# Patient Record
Sex: Male | Born: 1980
Health system: Southern US, Community
[De-identification: ages and names within clinical notes are randomized; demographics above are authoritative.]

## PROBLEM LIST (undated history)

## (undated) DIAGNOSIS — T7840XA Allergy, unspecified, initial encounter: Secondary | ICD-10-CM

## (undated) DIAGNOSIS — J939 Pneumothorax, unspecified: Secondary | ICD-10-CM

## (undated) HISTORY — DX: Pneumothorax, unspecified: J93.9

## (undated) HISTORY — PX: WISDOM TOOTH EXTRACTION: SHX21

## (undated) HISTORY — DX: Allergy, unspecified, initial encounter: T78.40XA

---

## 1999-05-19 ENCOUNTER — Emergency Department (HOSPITAL_COMMUNITY): Admission: EM | Admit: 1999-05-19 | Discharge: 1999-05-19 | Payer: Self-pay | Admitting: Emergency Medicine

## 1999-05-27 ENCOUNTER — Emergency Department (HOSPITAL_COMMUNITY): Admission: EM | Admit: 1999-05-27 | Discharge: 1999-05-27 | Payer: Self-pay | Admitting: Internal Medicine

## 2001-03-10 ENCOUNTER — Encounter: Payer: Self-pay | Admitting: Emergency Medicine

## 2001-03-10 ENCOUNTER — Observation Stay (HOSPITAL_COMMUNITY): Admission: EM | Admit: 2001-03-10 | Discharge: 2001-03-11 | Payer: Self-pay | Admitting: *Deleted

## 2001-03-11 ENCOUNTER — Encounter: Payer: Self-pay | Admitting: Surgery

## 2001-03-13 ENCOUNTER — Encounter: Admission: RE | Admit: 2001-03-13 | Discharge: 2001-03-13 | Payer: Self-pay | Admitting: Surgery

## 2001-03-13 ENCOUNTER — Encounter: Payer: Self-pay | Admitting: Surgery

## 2004-12-21 ENCOUNTER — Ambulatory Visit (HOSPITAL_COMMUNITY): Admission: RE | Admit: 2004-12-21 | Discharge: 2004-12-21 | Payer: Self-pay | Admitting: *Deleted

## 2004-12-21 ENCOUNTER — Emergency Department (HOSPITAL_COMMUNITY): Admission: EM | Admit: 2004-12-21 | Discharge: 2004-12-21 | Payer: Self-pay | Admitting: Emergency Medicine

## 2009-09-18 ENCOUNTER — Emergency Department (HOSPITAL_COMMUNITY): Admission: EM | Admit: 2009-09-18 | Discharge: 2009-09-19 | Payer: Self-pay | Admitting: Emergency Medicine

## 2011-05-06 NOTE — Discharge Summary (Signed)
[facility]  Patient:    Cory Washington, Cory Washington                     MRN: [id number] Adm. Date:  [id number] Disc. Date: [id number] Attending:  Cleatrice Burke Dictator:   Loura Pardon, P.A.                           Discharge Summary  DATE OF BIRTH:  September 06, 1981  TIME OF ADMISSION:  7 a.m.  TIME OF DISCHARGE:  7 p.m.  DISCHARGE DIAGNOSES:  A 15% spontaneous left pneumothorax.  SECONDARY DIAGNOSES:  Noncontributory.  PROCEDURES:  Chest x-ray x 2 to confirm stability of spontaneous left pneumothorax.  SURGEON:  Dr. Evelene Croon.  DISCHARGE DIET:  Cory Washington was discharged the same day as he was admitted with stable spontaneous left pneumothorax.  He is achieving excellent oxygen saturations on room air alone.  His pain which he has been feeling in the left anterior chest has been helped with Percocet, although it is still present.  DISCHARGE MEDICATIONS:  He goes home on the following medication:  Percocet 5/325 one to two tabs p.o. q.4-6h. p.r.n.  DISCHARGE FOLLOWUP:  He has follow-up with Dr. Laneta Simmers Tuesday, March 26; the office will call with the time.  He will get a chest x-ray one hour before the visit at [facility].  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 Laneta Simmers Tuesday, March 26.  BRIEF ADMISSION HISTORY:  Cory Washington is a 30 year old nonsmoker previously in good health who presented to the [facility] Emergency Room last evening with sudden onset of left anterior chest pain while washing a car.  Chest x-ray showed a 25% spontaneous left pneumothorax.  Oxygen saturations were normal on room air.  He has not had any prior history of pneumothorax.  HOSPITAL COURSE:  After admission to [facility] by Dr. Laneta Simmers, he had a follow-up chest x-ray which showed  no progression in the left spontaneous pneumothorax.  It was judged that he could go home with follow-up in two days with Dr. Laneta Simmers with a repeat chest x-ray.  He goes home on the medications and in stable condition with good oxygen saturations. DD:  03/11/01 TD:  03/12/01 Job: [id number]

## 2013-09-30 ENCOUNTER — Ambulatory Visit (INDEPENDENT_AMBULATORY_CARE_PROVIDER_SITE_OTHER): Payer: BC Managed Care – PPO | Admitting: Physician Assistant

## 2013-09-30 DIAGNOSIS — Z111 Encounter for screening for respiratory tuberculosis: Secondary | ICD-10-CM

## 2013-09-30 DIAGNOSIS — L603 Nail dystrophy: Secondary | ICD-10-CM

## 2013-09-30 DIAGNOSIS — Z0000 Encounter for general adult medical examination without abnormal findings: Secondary | ICD-10-CM

## 2013-09-30 DIAGNOSIS — Z23 Encounter for immunization: Secondary | ICD-10-CM

## 2013-09-30 NOTE — Progress Notes (Signed)
I have examined this patient along with the student and agree.  

## 2013-09-30 NOTE — Progress Notes (Signed)

## 2013-09-30 NOTE — Progress Notes (Signed)
Subjective:    Patient ID: Cory Washington, male    DOB: 12-14-1981, 32 y.o.   MRN: [id number]  HPI  Cory Washington is a 32 year old male who is here for a CPE and health screen for the [facility]. He complains of dry hands and feet along with nail abnormalities on his right second digit on his hand and his left 5th digit on his foot. His right third digit also had the abnormality but eventually it grew out. There is no itch associated with it. NO numbness, tingling, or burning in his hands or feet. He has no other complaints.   He would like the minimum done for [place] to complete his form. He does not want to have labs and declines the flu vaccine.   Review of Systems  Constitutional: Negative for fever, chills, appetite change, fatigue and unexpected weight change.  HENT: Negative for congestion, dental problem, ear discharge, ear pain, postnasal drip, rhinorrhea, sinus pressure, sore throat and trouble swallowing.   Eyes: Negative for pain, discharge, itching and visual disturbance.  Respiratory: Negative for cough, chest tightness and shortness of breath.   Cardiovascular: Negative for chest pain, palpitations and leg swelling.  Gastrointestinal: Negative for nausea, vomiting, abdominal pain, diarrhea and constipation.  Endocrine: Negative for cold intolerance, heat intolerance, polydipsia and polyuria.  Genitourinary: Negative for urgency, frequency, discharge, difficulty urinating, penile pain and testicular pain.  Musculoskeletal: Negative for arthralgias, back pain and joint swelling.  Skin: Negative for color change and rash.  Allergic/Immunologic: Positive for food allergies (suspects wheat). Negative for environmental allergies.  Neurological: Negative for dizziness, weakness, numbness and headaches.  Hematological: Negative for adenopathy. Does not bruise/bleed easily.  Psychiatric/Behavioral: Negative for dysphoric mood. The patient is not nervous/anxious.         Objective:   Physical Exam  Constitutional: Vital signs are normal. He appears well-developed and well-nourished.  HENT:  Head: Normocephalic and atraumatic.  Right Ear: Hearing, tympanic membrane, external ear and ear canal normal.  Left Ear: Hearing, tympanic membrane, external ear and ear canal normal.  Nose: Nose normal.  Mouth/Throat: Uvula is midline, oropharynx is clear and moist and mucous membranes are normal. Normal dentition. No dental caries.  Eyes: Conjunctivae and EOM are normal. Pupils are equal, round, and reactive to light. Right eye exhibits no discharge. Left eye exhibits no discharge. No scleral icterus.  Fundoscopic exam:      The right eye shows no arteriolar narrowing, no AV nicking, no exudate, no hemorrhage and no papilledema. The right eye shows red reflex.       The left eye shows no arteriolar narrowing, no AV nicking, no exudate, no hemorrhage and no papilledema. The left eye shows red reflex.  Neck: Trachea normal, normal range of motion and full passive range of motion without pain. No mass present.  Cardiovascular: Normal rate, regular rhythm, normal heart sounds and normal pulses.   Pulmonary/Chest: Effort normal and breath sounds normal.  Abdominal: Normal appearance and bowel sounds are normal. There is no tenderness.  Genitourinary:  Patient deferred exam  Musculoskeletal: Normal range of motion.  Lymphadenopathy:    He has no cervical adenopathy.  Neurological: He is alert. He has normal strength and normal reflexes. No cranial nerve deficit or sensory deficit. Coordination and gait normal.  Skin: Skin is warm and dry.     Psychiatric: He has a normal mood and affect.   Vital signs obtained and entered were normal, but recordings have disappeared  from the electronic record.     Assessment & Plan:  Routine general medical examination at a health care facility- Plan: patient declined GU exam and screening labs today. Counseled patient on  importance on monthly self-testicular exams; Age appropriate anticipatory guidance provided.  Screening-pulmonary TB - screened per CDC guidelines.  Not infectious.  Need for influenza vaccination - declined, despite urging.  Nail dystrophy - as one nail with similar symptoms has resolved spontaneously, he elects to monitor for now.

## 2013-09-30 NOTE — Patient Instructions (Signed)

## 2015-04-30 ENCOUNTER — Ambulatory Visit (INDEPENDENT_AMBULATORY_CARE_PROVIDER_SITE_OTHER): Payer: BLUE CROSS/BLUE SHIELD | Admitting: Sports Medicine

## 2015-04-30 VITALS — BP 100/62 | HR 84 | Temp 97.9°F | Resp 16 | Ht 70.0 in | Wt 135.0 lb

## 2015-04-30 DIAGNOSIS — Z0000 Encounter for general adult medical examination without abnormal findings: Secondary | ICD-10-CM | POA: Diagnosis not present

## 2015-04-30 NOTE — Progress Notes (Signed)
  Cory Washington - 34 y.o. male MRN [id number]  Date of birth: 05/31/1981  SUBJECTIVE: CC: 1. Well Check, foster parent     HPI:   no acute medical conditions  TB risk questionnaire in symptom questionnaire screened normal  Health maintenance up-to-date including pertussis vaccine for newborn expected later this month  Discussed routine lab testing patient declined time.      ROS:   her history of present illness and as above per screening.    HISTORY:  Past Medical, Surgical, Social, and Family History reviewed & updated per EMR.  Pertinent Historical Findings include: Social History   Occupational History  . plumber    Social History Main Topics  . Smoking status: Never Smoker   . Smokeless tobacco: Not on file  . Alcohol Use: No  . Drug Use: No  . Sexual Activity:    Partners: Female    CopyBirth Control/ Protection: None    History   Social History Narrative   Lives at home with his wife.    Short term foster parent   Son expected May 2016 - son - Lliam      OBJECTIVE:  VS:   HT:5\' 10"  (177.8 cm)   WT:135 lb (61.236 kg)  BMI:19.4          BP:100/62 mmHg  HR:84bpm  TEMP:97.9 F (36.6 C)(Oral)  RESP:98 %  Physical Exam  Constitutional: He is well-developed, well-nourished, and in no distress.  HENT:  Head: Normocephalic and atraumatic.  Right Ear: External ear normal.  Left Ear: External ear normal.  Neck: No JVD present. No tracheal deviation present.  Cardiovascular: Normal rate, regular rhythm and normal heart sounds.  Exam reveals no gallop and no friction rub.   No murmur heard. Pulmonary/Chest: Effort normal and breath sounds normal. No respiratory distress. He has no wheezes. He exhibits no tenderness.  Abdominal: Soft.  Musculoskeletal: He exhibits no edema or tenderness.  Neurological: He is alert.  Moves all 4 extremities spontaneously; no lateralization.  Skin: Skin is warm and dry. He is not diaphoretic.  Psychiatric: Mood, memory,  affect and judgment normal.  Vitals reviewed.   ASSESSMENT & PLAN: See problem based charting & AVS for additional documentation Problem List Items Addressed This Visit    Health care maintenance - Primary    Otherwise healthy. Foster parent form filled out and completed today. Discussed health maintenance as well as risk reduction Follow up when necessary         FOLLOW UP:  Return if symptoms worsen or fail to improve.

## 2015-04-30 NOTE — Progress Notes (Deleted)
Physical Exam

## 2015-04-30 NOTE — Progress Notes (Signed)

## 2015-04-30 NOTE — Assessment & Plan Note (Signed)
Otherwise healthy. Foster parent form filled out and completed today. Discussed health maintenance as well as risk reduction Follow up when necessary

## 2016-07-05 ENCOUNTER — Ambulatory Visit (HOSPITAL_COMMUNITY)
Admission: EM | Admit: 2016-07-05 | Discharge: 2016-07-05 | Disposition: A | Discharge status: Home / Self Care | Payer: 59 | Attending: Family Medicine | Admitting: Family Medicine

## 2016-07-05 ENCOUNTER — Encounter (HOSPITAL_COMMUNITY): Payer: Self-pay | Admitting: Emergency Medicine

## 2016-07-05 DIAGNOSIS — M7052 Other bursitis of knee, left knee: Secondary | ICD-10-CM

## 2016-07-05 MED ORDER — INDOMETHACIN 50 MG PO CAPS: 50.0000 mg | ORAL_CAPSULE | Freq: Two times a day (BID) | ORAL | Status: DC

## 2016-07-05 MED ORDER — HYDROCODONE-ACETAMINOPHEN 7.5-325 MG PO TABS: 1.0000 | ORAL_TABLET | Freq: Four times a day (QID) | ORAL | Status: DC | PRN

## 2016-07-05 MED ORDER — TRIAMCINOLONE ACETONIDE 40 MG/ML IJ SUSP (RADIOLOGY): 40.0000 mg | Freq: Once | INTRAMUSCULAR | Status: DC

## 2016-07-05 MED ORDER — TRIAMCINOLONE ACETONIDE 40 MG/ML IJ SUSP
INTRAMUSCULAR | Status: AC
  Filled 2016-07-05: qty 1

## 2016-07-05 NOTE — Discharge Instructions (Signed)
Bursitis Bursitis is when the fluid-filled sac (bursa) that covers and protects a joint is swollen (inflamed). Bursitis is most common near joints, especially the knees, elbows, hips, and shoulders.  HOME CARE  Take medicines only as told by your doctor.  If you were prescribed an antibiotic medicine, finish it all even if you start to feel better.  Rest the affected area as told by your doctor.  Keep the area raised up.  Avoid doing things that make the pain worse.  Apply ice to the injured area:  Place ice in a plastic bag.  Place a towel between your skin and the bag.  Leave the ice on for 20 minutes, 2-3 times a day.  Use splints, braces, pads, or walking aids as told by your doctor.  Keep all follow-up visits as told by your doctor. This is important. GET HELP IF:   You have more pain with home care.  You have a fever.  You have chills.   This information is not intended to replace advice given to you by your health care provider. Make sure you discuss any questions you have with your health care provider.   Document Released: 05/25/2010 Document Revised: 12/26/2014 Document Reviewed: 02/24/2014 Elsevier Interactive Patient Education 2016 Elsevier Inc.  [employer] LockerHeat Therapy Heat therapy can help ease sore, stiff, injured, and tight muscles and joints. Heat relaxes your muscles, which may help ease your pain.  RISKS AND COMPLICATIONS If you have any of the following conditions, do not use heat therapy unless your health care provider has approved:  Poor circulation.  Healing wounds or scarred skin in the area being treated.  Diabetes, heart disease, or high blood pressure.  Not being able to feel (numbness) the area being treated.  Unusual swelling of the area being treated.  Active infections.  Blood clots.  Cancer.  Inability to communicate pain. This may include young children and people who have problems with their brain function  (dementia).  Pregnancy. Heat therapy should only be used on old, pre-existing, or long-lasting (chronic) injuries. Do not use heat therapy on new injuries unless directed by your health care provider. HOW TO USE HEAT THERAPY There are several different kinds of heat therapy, including:  Moist heat pack.  Warm water bath.  Hot water bottle.  Electric heating pad.  Heated gel pack.  Heated wrap.  Electric heating pad. Use the heat therapy method suggested by your health care provider. Follow your health care provider's instructions on when and how to use heat therapy. GENERAL HEAT THERAPY RECOMMENDATIONS  Do not sleep while using heat therapy. Only use heat therapy while you are awake.  Your skin may turn pink while using heat therapy. Do not use heat therapy if your skin turns red.  Do not use heat therapy if you have new pain.  High heat or long exposure to heat can cause burns. Be careful when using heat therapy to avoid burning your skin.  Do not use heat therapy on areas of your skin that are already irritated, such as with a rash or sunburn. SEEK MEDICAL CARE IF:  You have blisters, redness, swelling, or numbness.  You have new pain.  Your pain is worse. MAKE SURE YOU:  Understand these instructions.  Will watch your condition.  Will get help right away if you are not doing well or get worse.   This information is not intended to replace advice given to you by your health care provider. Make sure you discuss any  questions you have with your health care provider.   Document Released: 02/27/2012 Document Revised: 12/26/2014 Document Reviewed: 01/28/2014 Elsevier Interactive Patient Education [employer]

## 2016-07-05 NOTE — ED Notes (Signed)
PT reports left knee pain that started yesterday around lunch time. PT reports it became more painful and it started to swell last night when PT took weight off it it. PT also reports nausea and vomiting last night, but is feeling better today. PT is not aware of any injury.

## 2016-07-05 NOTE — ED Provider Notes (Signed)
CSN: [id number]     Arrival date & time 07/05/16  1101 History   First MD Initiated Contact with Patient 07/05/16 1321     Chief Complaint  Patient presents with  . Knee Pain   (Consider location/radiation/quality/duration/timing/severity/associated sxs/prior Treatment) HPI History obtained from patient:  Pt presents with the cc of:  Left knee pain Duration of symptoms: 2 days Treatment prior to arrival: Ibuprofen Context: Patient works as a Barrister's clerkplumber's apprentice and has been doing quite a bit of kneeling over the last couple of days. He complains of pain in the left knee this morning he states that it became swollen. Ibuprofen is not helping this discomfort. Other symptoms include: Painful to walk Pain score: 4 FAMILY HISTORY: None    Past Medical History  Diagnosis Date  . Pneumothorax    History reviewed. No pertinent past surgical history. No family history on file. Social History  Substance Use Topics  . Smoking status: Never Smoker   . Smokeless tobacco: None  . Alcohol Use: No    Review of Systems  Denies: HEADACHE, NAUSEA, ABDOMINAL PAIN, CHEST PAIN, CONGESTION, DYSURIA, SHORTNESS OF BREATH  Allergies  Review of patient's allergies indicates no known allergies.  Home Medications   Prior to Admission medications   Medication Sig Start Date End Date Taking? Authorizing Provider  HYDROcodone-acetaminophen (NORCO) 7.5-325 MG tablet Take 1 tablet by mouth every 6 (six) hours as needed for moderate pain. 07/05/16   Tharon AquasFrank C Shoshannah Faubert, PA  indomethacin (INDOCIN) 50 MG capsule Take 1 capsule (50 mg total) by mouth 2 (two) times daily with a meal. 07/05/16   Tharon AquasFrank C Kaelan Emami, PA   Meds Ordered and Administered this Visit   Medications  triamcinolone acetonide (KENALOG-40) injection (RADIOLOGY ONLY) 40 mg (not administered)    BP 98/62 mmHg  Pulse 68  Temp(Src) 97.9 F (36.6 C) (Oral)  Resp 16  SpO2 100% No data found.   Physical Exam NURSES NOTES AND VITAL SIGNS  REVIEWED. CONSTITUTIONAL: Well developed, well nourished, no acute distress HEENT: normocephalic, atraumatic EYES: Conjunctiva normal NECK:normal ROM, supple, no adenopathy PULMONARY:No respiratory distress, normal effort ABDOMINAL: Soft, ND, NT BS+, No CVAT MUSCULOSKELETAL: Normal ROM of all extremities, left knee subpatellar bursa is swollen and tender to palpation warm to touch. There is no crepitus no signs of cellulitis no signs of infection. SKIN: warm and dry without rash PSYCHIATRIC: Mood and affect, behavior are normal  ED Course  Injection of joint Date/Time: 07/05/2016 3:35 PM Performed by: Tharon AquasPATRICK, Dina Warbington C Authorized by: Bradd CanaryKINDL, JAMES D Consent: Verbal consent obtained. Risks and benefits: risks, benefits and alternatives were discussed Consent given by: patient Patient identity confirmed: verbally with patient Time out: Immediately prior to procedure a "time out" was called to verify the correct patient, procedure, equipment, support staff and site/side marked as required. Preparation: Patient was prepped and draped in the usual sterile fashion. Local anesthesia used: no Patient tolerance: Patient tolerated the procedure well with no immediate complications   (including critical care time)  Labs Review Labs Reviewed - No data to display  Imaging Review No results found.   Visual Acuity Review  Right Eye Distance:   Left Eye Distance:   Bilateral Distance:    Right Eye Near:   Left Eye Near:    Bilateral Near:     Prescriptions for prednisone and hydrocodone are provided. Return to work note is given also.  MDM   1. Infrapatellar bursitis of left knee     Patient is reassured  that there are no issues that require transfer to higher level of care at this time or additional tests. Patient is advised to continue home symptomatic treatment. Patient is advised that if there are new or worsening symptoms to attend the emergency department, contact primary care  provider, or return to UC. Instructions of care provided discharged home in stable condition.    THIS NOTE WAS GENERATED USING A VOICE RECOGNITION SOFTWARE PROGRAM. ALL REASONABLE EFFORTS  WERE MADE TO PROOFREAD THIS DOCUMENT FOR ACCURACY.  I have verbally reviewed the discharge instructions with the patient. A printed AVS was given to the patient.  All questions were answered prior to discharge.      Tharon Aquas, PA 07/05/16 1535

## 2017-02-24 ENCOUNTER — Ambulatory Visit (HOSPITAL_COMMUNITY)
Admission: EM | Admit: 2017-02-24 | Discharge: 2017-02-24 | Disposition: A | Discharge status: Home / Self Care | Payer: 59 | Attending: Emergency Medicine | Admitting: Emergency Medicine

## 2017-02-24 ENCOUNTER — Encounter (HOSPITAL_COMMUNITY): Payer: Self-pay | Admitting: Emergency Medicine

## 2017-02-24 DIAGNOSIS — M7051 Other bursitis of knee, right knee: Secondary | ICD-10-CM

## 2017-02-24 MED ORDER — DICLOFENAC POTASSIUM 50 MG PO TABS: 50.0000 mg | ORAL_TABLET | Freq: Three times a day (TID) | ORAL | 0 refills | Status: DC

## 2017-02-24 NOTE — Discharge Instructions (Signed)
Apply ice over the inflamed bursa. Take the diclofenac as directed with food to help with inflammation. Wear the wrap around the swollen bursa for compression to help keep it from collecting fluid. Try not to work on your knees and wear knee protectors. If this continues he may need to follow-up with an orthopedist. The on-call orthopedist as listed on this first page.

## 2017-02-24 NOTE — ED Provider Notes (Signed)
CSN: [id number]     Arrival date & time 02/24/17  1646 History   First MD Initiated Contact with Patient 02/24/17 1734     Chief Complaint  Patient presents with  . Knee Pain   (Consider location/radiation/quality/duration/timing/severity/associated sxs/prior Treatment) [age] showed male complaining of right knee pain. He states he was seen in the urgent care here several months ago in 2017 for the same thing. He was diagnosed with an anterior infra patellar bursitis. 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 He wears knee protectors most of the time. This episode started about one week ago. Denies injury to the knee joint itself. The lesion is located distal to the knee joint, over the tibial tuberosity.      Past Medical History:  Diagnosis Date  . Pneumothorax    History reviewed. No pertinent surgical history. History reviewed. No pertinent family history. Social History  Substance Use Topics  . Smoking status: Never Smoker  . Smokeless tobacco: Never Used  . Alcohol use No    Review of Systems  Constitutional: Negative.   Respiratory: Negative.   Gastrointestinal: Negative.   Genitourinary: Negative.   Musculoskeletal:       As per HPI  Skin: Negative.   Neurological: Negative for dizziness, weakness, numbness and headaches.  All other systems reviewed and are negative.   Allergies  Patient has no known allergies.  Home Medications   Prior to Admission medications   Medication Sig Start Date End Date Taking? Authorizing Provider  diclofenac (CATAFLAM) 50 MG tablet Take 1 tablet (50 mg total) by mouth 3 (three) times daily. One tablet TID with food prn pain. 02/24/17   Hayden Rasmussenavid Gavriel Holzhauer, NP  HYDROcodone-acetaminophen (NORCO) 7.5-325 MG tablet Take 1 tablet by mouth every 6 (six) hours as needed for moderate pain. 07/05/16   Tharon AquasFrank C Patrick, PA  indomethacin (INDOCIN) 50 MG capsule Take 1 capsule (50 mg total) by mouth 2 (two) times daily with a meal. 07/05/16    Tharon AquasFrank C Patrick, PA   Meds Ordered and Administered this Visit  Medications - No data to display  BP 104/64 (BP Location: Right Arm)   Pulse 71   Temp 98.2 F (36.8 C) (Oral)   Resp 18   SpO2 100%  No data found.   Physical Exam  Constitutional: He is oriented to person, place, and time. He appears well-developed and well-nourished.  Neck: Neck supple.  Cardiovascular: Normal rate.   Pulmonary/Chest: Effort normal.  Musculoskeletal:  Right knee appears to be unaffected. No tenderness or swelling to the knee joint itself. Just distal to the knee over the anterior tibial tuberosity the bursa is swollen and tense. Minor erythema to the anterior most aspect of the bursa. No lymphangitis. No increase in warmth or signs of infection.  Neurological: He is alert and oriented to person, place, and time.  Skin: Skin is warm and dry.  Psychiatric: He has a normal mood and affect.  Nursing note and vitals reviewed.   Urgent Care Course     .Joint Aspiration/Arthrocentesis Date/Time: 02/24/2017 6:05 PM Performed by: Phineas RealMABE, Lonny Eisen Authorized by: Domenick GongMORTENSON, ASHLEY   Consent:    Consent obtained:  Verbal   Consent given by:  Patient   Risks discussed:  Bleeding and infection Location:    Location:  Knee Anesthesia (see MAR for exact dosages):    Anesthesia method:  Local infiltration   Local anesthetic:  Lidocaine 2% WITH epi Procedure details:    Preparation:  Patient was prepped and draped in usual sterile fashion     Needle gauge:  18 G   Ultrasound guidance: no     Approach:  Anterior   Aspirate amount:  6   Aspirate characteristics:  Blood-tinged   Steroid injected: no     Specimen collected: no   Post-procedure details:    Dressing:  Adhesive bandage and gauze roll   Patient tolerance of procedure:  Tolerated well, no immediate complications Comments:     Aspiration of knee bursa, not joint.   (including critical care time)  Labs Review Labs Reviewed - No data to  display  Imaging Review No results found.   Visual Acuity Review  Right Eye Distance:   Left Eye Distance:   Bilateral Distance:    Right Eye Near:   Left Eye Near:    Bilateral Near:         MDM   1. Infrapatellar bursitis of right knee    Apply ice over the inflamed bursa. Take the diclofenac as directed with food to help with inflammation. Wear the wrap around the swollen bursa for compression to help keep it from collecting fluid. Try not to work on your knees and wear knee protectors. If this continues he may need to follow-up with an orthopedist. The on-call orthopedist as listed on this first page. Meds ordered this encounter  Medications  . diclofenac (CATAFLAM) 50 MG tablet    Sig: Take 1 tablet (50 mg total) by mouth 3 (three) times daily. One tablet TID with food prn pain.    Dispense:  21 tablet    Refill:  0    Order Specific Question:   Supervising Provider    Answer:   Domenick Gong [[id number]]       Hayden Rasmussen, NP 02/24/17 1810

## 2017-02-24 NOTE — ED Triage Notes (Signed)
Here for right knee pain onset 1 week   Reports he was seen here 1 year ago for bursitis on the same knee  Pain increases w/activity and not able to extend leg  A&O x4... NAD

## 2017-02-27 ENCOUNTER — Ambulatory Visit: Payer: 59 | Admitting: Family Medicine

## 2017-03-30 ENCOUNTER — Ambulatory Visit (HOSPITAL_BASED_OUTPATIENT_CLINIC_OR_DEPARTMENT_OTHER)
Admission: RE | Admit: 2017-03-30 | Discharge: 2017-03-30 | Disposition: A | Discharge status: Home / Self Care | Payer: 59 | Source: Ambulatory Visit | Attending: Family Medicine | Admitting: Family Medicine

## 2017-03-30 ENCOUNTER — Encounter: Payer: Self-pay | Admitting: Family Medicine

## 2017-03-30 ENCOUNTER — Ambulatory Visit (INDEPENDENT_AMBULATORY_CARE_PROVIDER_SITE_OTHER): Payer: 59 | Admitting: Family Medicine

## 2017-03-30 VITALS — BP 116/67 | HR 76 | Temp 98.4°F | Ht 70.0 in | Wt 137.2 lb

## 2017-03-30 DIAGNOSIS — R05 Cough: Secondary | ICD-10-CM | POA: Diagnosis not present

## 2017-03-30 DIAGNOSIS — Z13220 Encounter for screening for lipoid disorders: Secondary | ICD-10-CM | POA: Diagnosis not present

## 2017-03-30 DIAGNOSIS — Z8709 Personal history of other diseases of the respiratory system: Secondary | ICD-10-CM

## 2017-03-30 DIAGNOSIS — Z130 Encounter for screening for diseases of the blood and blood-forming organs and certain disorders involving the immune mechanism: Secondary | ICD-10-CM | POA: Diagnosis not present

## 2017-03-30 DIAGNOSIS — R059 Cough, unspecified: Secondary | ICD-10-CM

## 2017-03-30 DIAGNOSIS — Z1329 Encounter for screening for other suspected endocrine disorder: Secondary | ICD-10-CM

## 2017-03-30 DIAGNOSIS — Z131 Encounter for screening for diabetes mellitus: Secondary | ICD-10-CM | POA: Diagnosis not present

## 2017-03-30 DIAGNOSIS — Z0000 Encounter for general adult medical examination without abnormal findings: Secondary | ICD-10-CM

## 2017-03-30 NOTE — Patient Instructions (Signed)
It was very nice to see you today- please have your blood drawn and then go to the imaging department on the ground floor for your chest x-ray  I will be in touch with your results asap Take care!

## 2017-03-30 NOTE — Progress Notes (Signed)
[facility] [address], [address] [place], [place] [address] [phone]  Date:  03/30/2017   Name:  Cory Washington   DOB:  20-Aug-1981   MRN:  [id number]  PCP:  No PCP Per Patient    Chief Complaint: Establish Care (Pt here to est care. )   History of Present Illness:  Cory Washington is a 36 y.o. very pleasant male patient who presents with the following:  Here today to establish care He is generally in good health, has not needed much in the way of health care over the past several years No labs on file   He would like to have a CPE today He has not had any blood work in a while Last tetanus was in 2008- declines to update today but will boost if he has any sort of injury He works in [employer] He is married to [place], his son is nearly 2 yo. His family is in good health   He did have a spontaneous pneumothorax when he was approx 36 yo.  A bleb ruptured- he had sudden onset of sharp back pain and got imaging- the pneumo self resolved and he did not require any treatment. He is not aware of any history of this in his family.    Never had any operations  He is not fasting today He has had trouble with pre-patellar bursitis of the right knee- this is "99%" better at this time but he has to be very careful to wear his kneepads when he is working  He has noted a cough and sinus drainage for the last few weeks ,but feels that this is getting better on its own  He did have a sore throat a couple of months ago but this got better when he stopped caffeine- he thought that it might be due to GERD He does not smoke or use chewing tobacco    Patient Active Problem List   Diagnosis Date Noted  . Health care maintenance 04/30/2015    Past Medical History:  Diagnosis Date  . Pneumothorax     No past surgical history on file.  Social History  Substance Use Topics  . Smoking status: Never Smoker  .  Smokeless tobacco: Never Used  . Alcohol use No    Family History  Problem Relation Age of Onset  . Kidney disease Father     No Known Allergies  Medication list has been reviewed and updated.  No current outpatient prescriptions on file prior to visit.   No current facility-administered medications on file prior to visit.     Review of Systems:  As per HPI- otherwise negative. Notes that he has always been thin despite his efforts to gain   Physical Examination: Vitals:   03/30/17 1641  BP: 116/67  Pulse: 76  Temp: 98.4 F (36.9 C)   Vitals:   03/30/17 1641  Weight: 137 lb 3.2 oz (62.2 kg)  Height:  (1.778 m)   Body mass index is 19.69 kg/m. Ideal Body Weight: Weight in (lb) to have BMI = 25: 173.9  GEN: WDWN, NAD, Non-toxic, A & O x 3, thin build, looks well HEENT: Atraumatic, Normocephalic. Neck supple. No masses, No LAD.  Bilateral TM wnl, oropharynx normal.  PEERL,EOMI.   Ears and Nose: No external deformity. CV: RRR, No M/G/R. No JVD. No thrill. No extra heart sounds. PULM: CTA B, no wheezes, crackles, rhonchi.  No retractions. No resp. distress. No accessory muscle use. ABD: S, NT, ND. No rebound. No HSM. EXTR: No c/c/e NEURO Normal gait.  PSYCH: Normally interactive. Conversant. Not depressed or anxious appearing.  Calm demeanor.    Assessment and Plan: Physical exam  Cough - Plan: DG Chest 2 View  Screening for deficiency anemia - Plan: CBC  Screening for diabetes mellitus - Plan: Comprehensive metabolic panel, Hemoglobin A1c  Screening for hyperlipidemia - Plan: Lipid panel  Screening for thyroid disorder - Plan: TSH  History of pneumothorax - Plan: DG Chest 2 View  CPE today- labs pending as above He has no other concerns today Will check a CXR given his history of spontaneous pneumothorax  Signed Abbe Amsterdam, MD

## 2017-03-31 LAB — CBC
HEMATOCRIT: 42.6 % (ref 39.0–52.0)
HEMOGLOBIN: 13.8 g/dL (ref 13.0–17.0)
MCHC: 32.3 g/dL (ref 30.0–36.0)
MCV: 86.7 fl (ref 78.0–100.0)
PLATELETS: 209 10*3/uL (ref 150.0–400.0)
RBC: 4.91 Mil/uL (ref 4.22–5.81)
RDW: 13.3 % (ref 11.5–15.5)
WBC: 7 10*3/uL (ref 4.0–10.5)

## 2017-03-31 LAB — LIPID PANEL
CHOL/HDL RATIO: 3
CHOLESTEROL: 162 mg/dL (ref 0–200)
HDL: 51 mg/dL (ref >39.00)
LDL Cholesterol: 100 mg/dL — ABNORMAL HIGH (ref 0–99)
NonHDL: 111.36
TRIGLYCERIDES: 59 mg/dL (ref 0.0–149.0)
VLDL: 11.8 mg/dL (ref 0.0–40.0)

## 2017-03-31 LAB — COMPREHENSIVE METABOLIC PANEL
ALBUMIN: 4.3 g/dL (ref 3.5–5.2)
ALT: 24 U/L (ref 0–53)
AST: 28 U/L (ref 0–37)
Alkaline Phosphatase: 71 U/L (ref 39–117)
BILIRUBIN TOTAL: 0.3 mg/dL (ref 0.2–1.2)
BUN: 20 mg/dL (ref 6–23)
CALCIUM: 9.6 mg/dL (ref 8.4–10.5)
CHLORIDE: 105 meq/L (ref 96–112)
CO2: 30 meq/L (ref 19–32)
Creatinine, Ser: 0.97 mg/dL (ref 0.40–1.50)
GFR: 92.81 mL/min (ref >60.00)
Glucose, Bld: 78 mg/dL (ref 70–99)
Potassium: 4 mEq/L (ref 3.5–5.1)
Sodium: 141 mEq/L (ref 135–145)
Total Protein: 7.3 g/dL (ref 6.0–8.3)

## 2017-03-31 LAB — HEMOGLOBIN A1C: HEMOGLOBIN A1C: 5.6 % (ref 4.6–6.5)

## 2017-03-31 LAB — TSH: TSH: 0.58 u[IU]/mL (ref 0.35–4.50)

## 2017-11-15 NOTE — Progress Notes (Signed)
[facility] [address], [address] [place], [place] [address] [phone] [phone]  Date:  11/16/2017   Name:  Cory Washington   DOB:  10/20/1981   MRN:  [id number]  PCP:  Patient, No Pcp Per    Chief Complaint: Elbow Pain (c/o left elbow pain x 1-2 months. would like to discuss gastro concerns. )   History of Present Illness:  Cory Washington is a 36 y.o. very pleasant male patient who presents with the following: I last saw him for a CPE in April Generally healthy young man here today with concern of elbow pain and GI issues  He has noted some abd pain that may occur once a month or so. More so will occur if he had "a cheat meal" and ate something greasy or fatty They do try to eat healthy- not trying to lose weight, but he and his wife generally eat more low fat goods.  He is not having any vomiting He will not have a BM every day- he will skip some days and then have a larger stool This is how he has always been- no change here  No diarrhea He has noted these GI issues for 4-6 months. It does not occur often enough to really bother him He is not aware of any family history of gallbladder issues  He has always been slim, has not lost weight Wt Readings from Last 3 Encounters:  11/16/17 139 lb 6.4 oz (63.2 kg)  03/30/17 137 lb 3.2 oz (62.2 kg)  04/30/15 135 lb (61.2 kg)     He has noted pain in his left elbow for the last year or so.  It may be stiff in the am, and can hurt some if he lifts with the arm abducted. 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  She is on progesterone with this pregnancy.  I wished them well and hope that the pregnancy will continue to progress   Patient Active Problem List    Diagnosis Date Noted  . Health care maintenance 04/30/2015    Past Medical History:  Diagnosis Date  . Pneumothorax     History reviewed. No pertinent surgical history.  Social History   Tobacco Use  . Smoking status: Never Smoker  . Smokeless tobacco: Never Used  Substance Use Topics  . Alcohol use: No  . Drug use: No    Family History  Problem Relation Age of Onset  . Kidney disease Father     No Known Allergies  Medication list has been reviewed and updated.  No current outpatient medications on file prior to visit.   No current facility-administered medications on file prior to visit.     Review of Systems:  As per HPI- otherwise negative.   Physical Examination: Vitals:   11/16/17 1720  BP: 110/72  Pulse: 74  Temp: 97.6 F (36.4 C)  SpO2: 98%   Vitals:   11/16/17 1720  Weight: 139 lb 6.4 oz (63.2 kg)  Height: 5\' 10"  (1.778 m)   Body mass index is 20 kg/m. Ideal Body Weight: Weight in (lb) to have BMI = 25: 173.9  GEN: WDWN, NAD, Non-toxic, A & O x 3,slender  build, looks well HEENT: Atraumatic, Normocephalic. Neck supple. No masses, No LAD. Ears and Nose: No external deformity. CV: RRR, No M/G/R. No JVD. No thrill. No extra heart sounds. PULM: CTA B, no wheezes, crackles, rhonchi. No retractions. No resp. distress. No accessory muscle use. ABD: S, NT, ND, +BS. No rebound. No HSM. EXTR: No c/c/e NEURO Normal gait.  PSYCH: Normally interactive. Conversant. Not depressed or anxious appearing.  Calm demeanor.  Left elbow: mild tenderness over the lateral epicondyle, no swelling, redness or heat  He has some discomfort with resisted supination but not pronation  Assessment and Plan: Postprandial abdominal pain in right upper quadrant - Plan: US Abdomen Limited RUQ, Amylase, Lipase  Lateral epicondylitis of left elbow  Here today with a couple of concerns.  He has noted some upper belly pain after eating a fatty meal, sporadically over the  last several months CMP in April was normal. His wife is concerned about his pancreas so will check enzymes RUQ US ordered- follow-up pending results He has lateral epicondylitis.  Right now not interested in a steroid injection.  He will let me know if he would like to consider this down the line. For now will treat conservatively   Signed Abbe AmsterdamJessica Copland, MD

## 2017-11-16 ENCOUNTER — Ambulatory Visit: Payer: 59 | Admitting: Family Medicine

## 2017-11-16 ENCOUNTER — Encounter: Payer: Self-pay | Admitting: Family Medicine

## 2017-11-16 VITALS — BP 110/72 | HR 74 | Temp 97.6°F | Ht 70.0 in | Wt 139.4 lb

## 2017-11-16 DIAGNOSIS — M7712 Lateral epicondylitis, left elbow: Secondary | ICD-10-CM

## 2017-11-16 DIAGNOSIS — R1011 Right upper quadrant pain: Secondary | ICD-10-CM | POA: Diagnosis not present

## 2017-11-16 NOTE — Patient Instructions (Signed)
We are going to arrange an ultrasound of your gallbladder- I will be in touch with this result asap We will then decide on any further evaluation I think you have tennis elbow on the left- lateral epicondylitis. Try to avoid movements that cause pain, and you might consider an OTC tennis elbow strap to wear while you are working.  If this fails to get better a steroid injection can be very helpful!

## 2017-11-17 LAB — LIPASE: Lipase: 23 U/L (ref 11.0–59.0)

## 2017-11-17 LAB — AMYLASE: Amylase: 35 U/L (ref 27–131)

## 2017-11-22 ENCOUNTER — Ambulatory Visit (HOSPITAL_BASED_OUTPATIENT_CLINIC_OR_DEPARTMENT_OTHER)
Admission: RE | Admit: 2017-11-22 | Discharge: 2017-11-22 | Disposition: A | Discharge status: Home / Self Care | Payer: 59 | Source: Ambulatory Visit | Attending: Family Medicine | Admitting: Family Medicine

## 2017-11-22 DIAGNOSIS — R9389 Abnormal findings on diagnostic imaging of other specified body structures: Secondary | ICD-10-CM | POA: Diagnosis not present

## 2017-11-22 DIAGNOSIS — R1011 Right upper quadrant pain: Secondary | ICD-10-CM | POA: Diagnosis present

## 2017-11-22 DIAGNOSIS — K828 Other specified diseases of gallbladder: Secondary | ICD-10-CM | POA: Diagnosis not present

## 2017-11-23 ENCOUNTER — Ambulatory Visit (HOSPITAL_BASED_OUTPATIENT_CLINIC_OR_DEPARTMENT_OTHER): Payer: 59

## 2017-11-24 ENCOUNTER — Encounter: Payer: Self-pay | Admitting: Family Medicine

## 2017-11-24 ENCOUNTER — Other Ambulatory Visit: Payer: Self-pay | Admitting: Family Medicine

## 2017-11-24 DIAGNOSIS — K828 Other specified diseases of gallbladder: Secondary | ICD-10-CM

## 2018-03-21 ENCOUNTER — Ambulatory Visit: Payer: Self-pay

## 2018-03-21 NOTE — Telephone Encounter (Signed)
Patient called in with c/o "chest pain."  He says "it started about 3 days ago and it is only there at rest. When I am working or being active, I don't feel it. As soon as I am still and resting, I feel it and it is about a 1-2, just an uncomfortable nagging sensation. It lasts for a few minutes the goes away." I asked about other symptoms, he denies. According to protocol, see PCP within 3 days, no availability with PCP, appointment made for Friday, 03/23/18 at 1540 with Dr. Drue NovelPaz, care advice given, patient verbalized understanding.  Reason for Disposition . [1] Chest pain(s) lasting a few seconds AND [2] persists > 3 days  Answer Assessment - Initial Assessment Questions 1. LOCATION: "Where does it hurt?"       Left side mostly, little centered 2. RADIATION: "Does the pain go anywhere else?" (e.g., into neck, jaw, arms, back)     May move to right side 3. ONSET: "When did the chest pain begin?" (Minutes, hours or days)      Few days ago 4. PATTERN "Does the pain come and go, or has it been constant since it started?"  "Does it get worse with exertion?"      Comes and goes 5. DURATION: "How long does it last" (e.g., seconds, minutes, hours)     Few minutes (present when resting) 6. SEVERITY: "How bad is the pain?"  (e.g., Scale 1-10; mild, moderate, or severe)    - MILD (1-3): doesn't interfere with normal activities     - MODERATE (4-7): interferes with normal activities or awakens from sleep    - SEVERE (8-10): excruciating pain, unable to do any normal activities       1-2 7. CARDIAC RISK FACTORS: "Do you have any history of heart problems or risk factors for heart disease?" (e.g., prior heart attack, angina; high blood pressure, diabetes, being overweight, high cholesterol, smoking, or strong family history of heart disease)     No 8. 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 CAUSE: "What do you think is causing the chest pain?"     Possible stress, not enough rest 10. OTHER SYMPTOMS: "Do you have any other symptoms?" (e.g., dizziness, nausea, vomiting, sweating, fever, difficulty breathing, cough)       No 11. PREGNANCY: "Is there any chance you are pregnant?" "When was your last menstrual period?"       N/A  Protocols used: CHEST PAIN-A-AH

## 2018-03-23 ENCOUNTER — Ambulatory Visit: Payer: 59 | Admitting: Internal Medicine

## 2018-03-23 ENCOUNTER — Ambulatory Visit (HOSPITAL_BASED_OUTPATIENT_CLINIC_OR_DEPARTMENT_OTHER)
Admission: RE | Admit: 2018-03-23 | Discharge: 2018-03-23 | Disposition: A | Discharge status: Home / Self Care | Payer: 59 | Source: Ambulatory Visit | Attending: Internal Medicine | Admitting: Internal Medicine

## 2018-03-23 ENCOUNTER — Encounter: Payer: Self-pay | Admitting: Internal Medicine

## 2018-03-23 VITALS — BP 116/68 | HR 62 | Temp 97.8°F | Resp 16 | Ht 70.0 in | Wt 145.1 lb

## 2018-03-23 DIAGNOSIS — F419 Anxiety disorder, unspecified: Secondary | ICD-10-CM

## 2018-03-23 DIAGNOSIS — R079 Chest pain, unspecified: Secondary | ICD-10-CM | POA: Diagnosis not present

## 2018-03-23 DIAGNOSIS — K828 Other specified diseases of gallbladder: Secondary | ICD-10-CM | POA: Diagnosis not present

## 2018-03-23 NOTE — Progress Notes (Signed)
Subjective:    Patient ID: Cory Washington, male    DOB: [birth year], 37 y.o.   MRN: [id number]  DOS:  03/23/2018 Type of visit - description : Acute visit Interval history: Few  days history of very mild discomfort at the left pectoral area.  The pain is usually at night when he is resting at home.  At work, he is very active, his job is very physical, when he is moving and picking up tools and equipment he has no symptoms. Denies any injury but a couple of days prior to the onset of symptoms he did some exercises including push-ups, pull-ups and he thinks he might have pulled a muscle.  Midway thru the visit, he asked me if this could be stress related.  He become very emotional and told me that he is under a lot of stress for a while and most recently the stress increased for the last  2-3 weeks.  He has a very demanding, full-time job, he goes home to his wife and his 65-1/2-year-old baby and there is a still a lot more to do and he feels overwhelmed at times.   Review of Systems Has fever chills.  No weight loss No neck pain, shoulder pain. No cough or DOE No abdominal pain, nausea or vomiting. Denies any calf pain or swelling. He does drive from here to [facility] every day. Denies suicidal or homicidal ideas.   Past Medical History:  Diagnosis Date  . Pneumothorax     No past surgical history on file.  Social History   Socioeconomic History  . Marital status: Married    Spouse name: Not on file  . Number of children: Not on file  . Years of education: Not on file  . Highest education level: Not on file  Occupational History  . Occupation: plumber  Social Needs  . Financial resource strain: Not on file  . Food insecurity:    Worry: Not on file    Inability: Not on file  . Transportation needs:    Medical: Not on file    Non-medical: Not on file  Tobacco Use  . Smoking status: Never Smoker  . Smokeless tobacco: Never Used  Substance and Sexual  Activity  . Alcohol use: No  . Drug use: No  . Sexual activity: Yes    Partners: Female    Birth control/protection: None  Lifestyle  . Physical activity:    Days per week: Not on file    Minutes per session: Not on file  . Stress: Not on file  Relationships  . Social connections:    Talks on phone: Not on file    Gets together: Not on file    Attends religious service: Not on file    Active member of club or organization: Not on file    Attends meetings of clubs or organizations: Not on file    Relationship status: Not on file  . Intimate partner violence:    Fear of current or ex partner: Not on file    Emotionally abused: Not on file    Physically abused: Not on file    Forced sexual activity: Not on file  Other Topics Concern  . Not on file  Social History Narrative   Lives at home with his wife.    Short term foster parent   Son expected May 2016 - son - Lliam      Allergies as of 03/23/2018   No Known Allergies  Medication List    as of 03/23/2018 11:59 PM   You have not been prescribed any medications.        Objective:   Physical Exam BP 116/68 (BP Location: Right Arm, Patient Position: Sitting, Cuff Size: Small)   Pulse 62   Temp 97.8 F (36.6 C) (Oral)   Resp 16   Ht 5\' 10"  (1.778 m)   Wt 145 lb 2 oz (65.8 kg)   SpO2 98%   BMI 20.82 kg/m  General:   Well developed, well nourished . NAD.  HEENT:  Normocephalic . Face symmetric, atraumatic Lungs:  CTA B Normal respiratory effort, no intercostal retractions, no accessory muscle use. Chest wall: No TTP Heart: RRR,  no murmur.  no pretibial edema bilaterally  Abdomen:  Not distended, soft, non-tender. No rebound or rigidity.   Skin: Not pale. Not jaundice MSK: Shoulder rotation normal Neurologic:  alert & oriented X3.  Speech normal, gait appropriate for age and unassisted Psych--  Cognition and judgment appear intact.  Cooperative with normal attention span and concentration.    Behavior appropriate. He seems slightly apprehensive, was tearful when we start talking about his a stress     Assessment & Plan:   37 year old gentleman with history Chest pain: As described above, very atypical for CAD, PE or other serious conditions.  Also very atypical for GB problems, see below.  No FH CAD, not a smoker, very active. Recommend a chest x-ray, Tylenol or ibuprofen as needed and call if not gradually improving. Gallbladder sludge: Back in November he had on and off abdominal pain, gallbladder ultrasound showed the sludge, was refer to surgery, did not keep the appointment mostly because he is now asymptomatic, advised patient that his gallbladder is not healthy and his symptoms might come back anytime.  He declined  to see the surgeon. Anxiety: The patient is evidently under a lot of anxiety, I counseled him about the stress management.  Strongly encouraged to reach out to his PCP if he needs further help.  He was appreciative of my input.  F2F> 35

## 2018-03-23 NOTE — Progress Notes (Signed)
Pre visit review using our clinic review tool, if applicable. No additional management support is needed unless otherwise documented below in the visit note. 

## 2018-03-23 NOTE — Patient Instructions (Signed)
Get your chest x-ray downstairs  Okay to take ibuprofen or Tylenol as needed for the pain  Go to the ER or call if they chest pain is severe or if changes   Please reach out to Dr. Patsy Lageropland if your stress is not improving in the next few weeks

## 2018-12-11 IMAGING — DX DG CHEST 2V
2 series · 2 of 2 positions shown · non-contrast
Comparison: 03/30/2017

CLINICAL DATA: Left-sided chest pain for 1 week

EXAM:
CHEST - 2 VIEW

[chest pa]
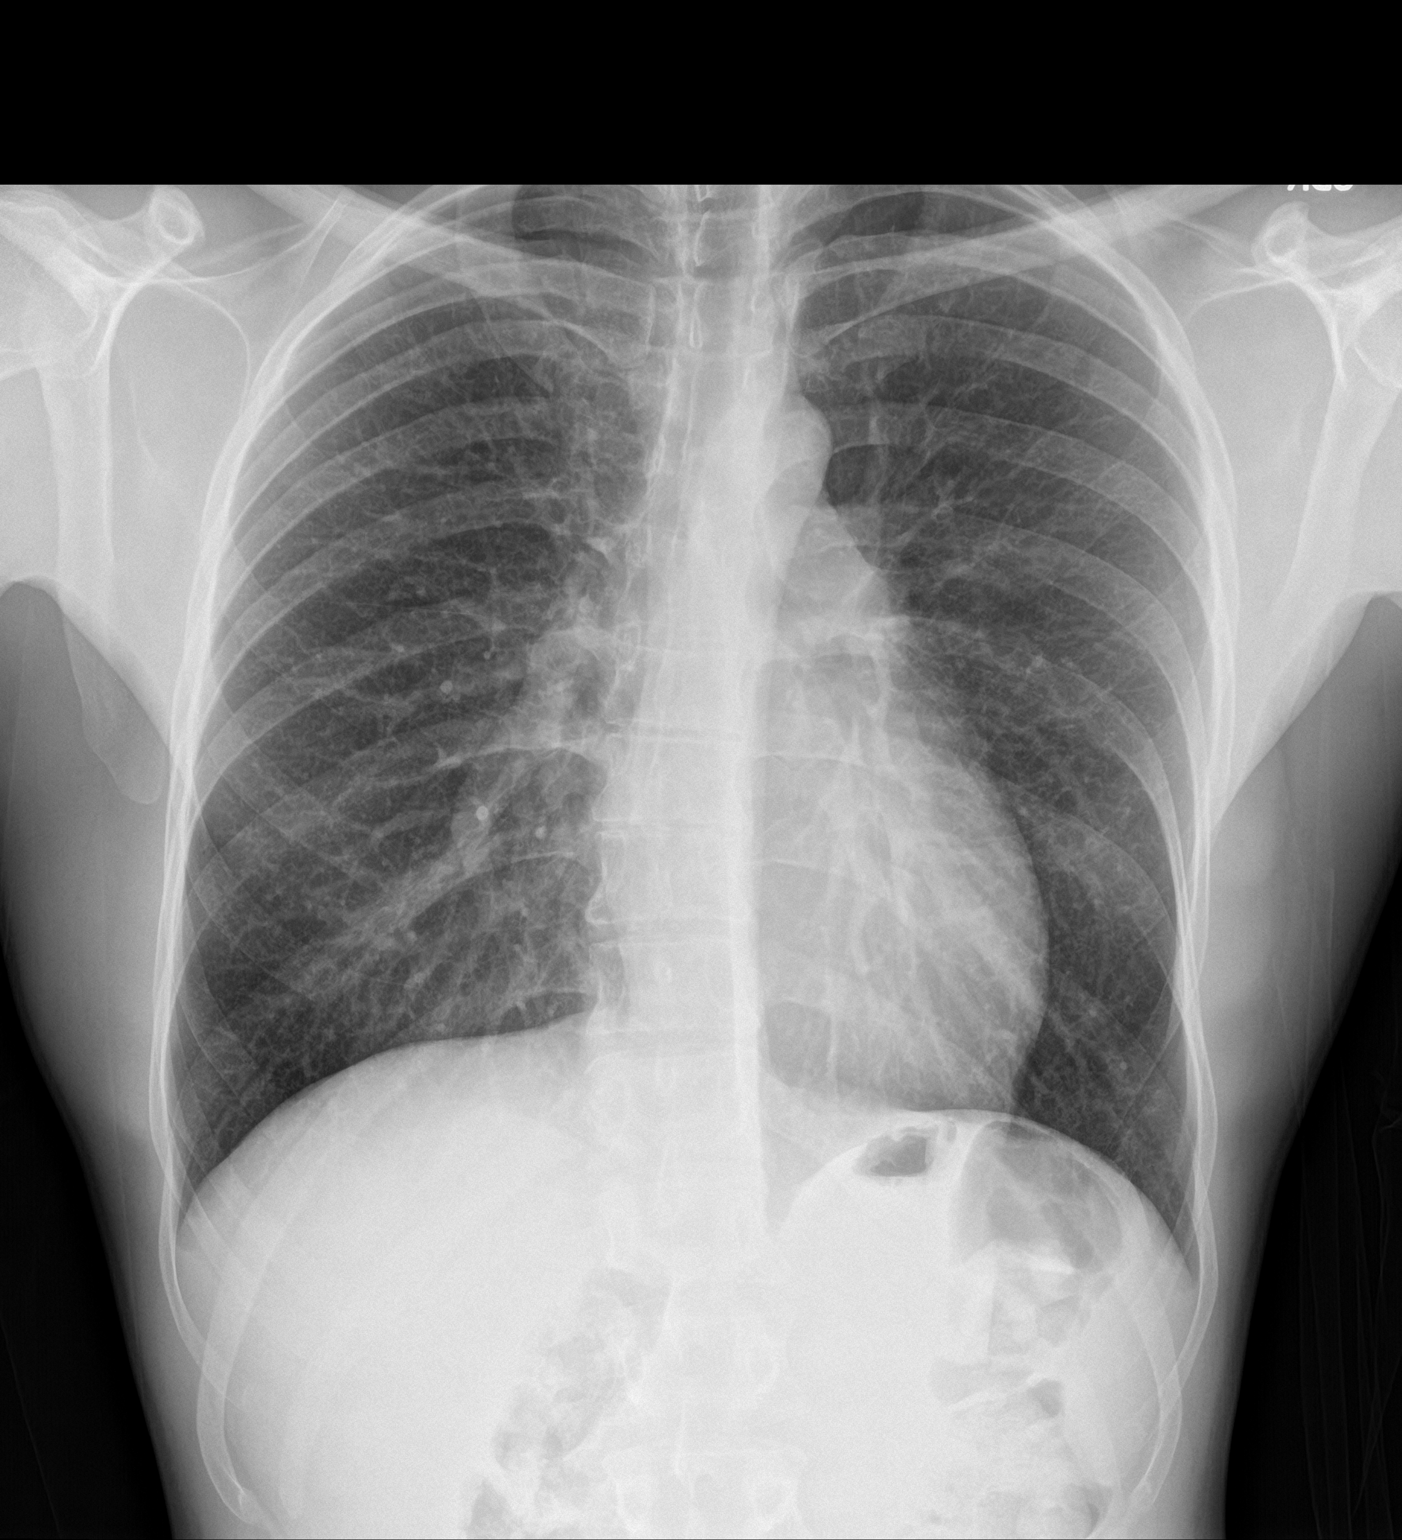

[chest lat]
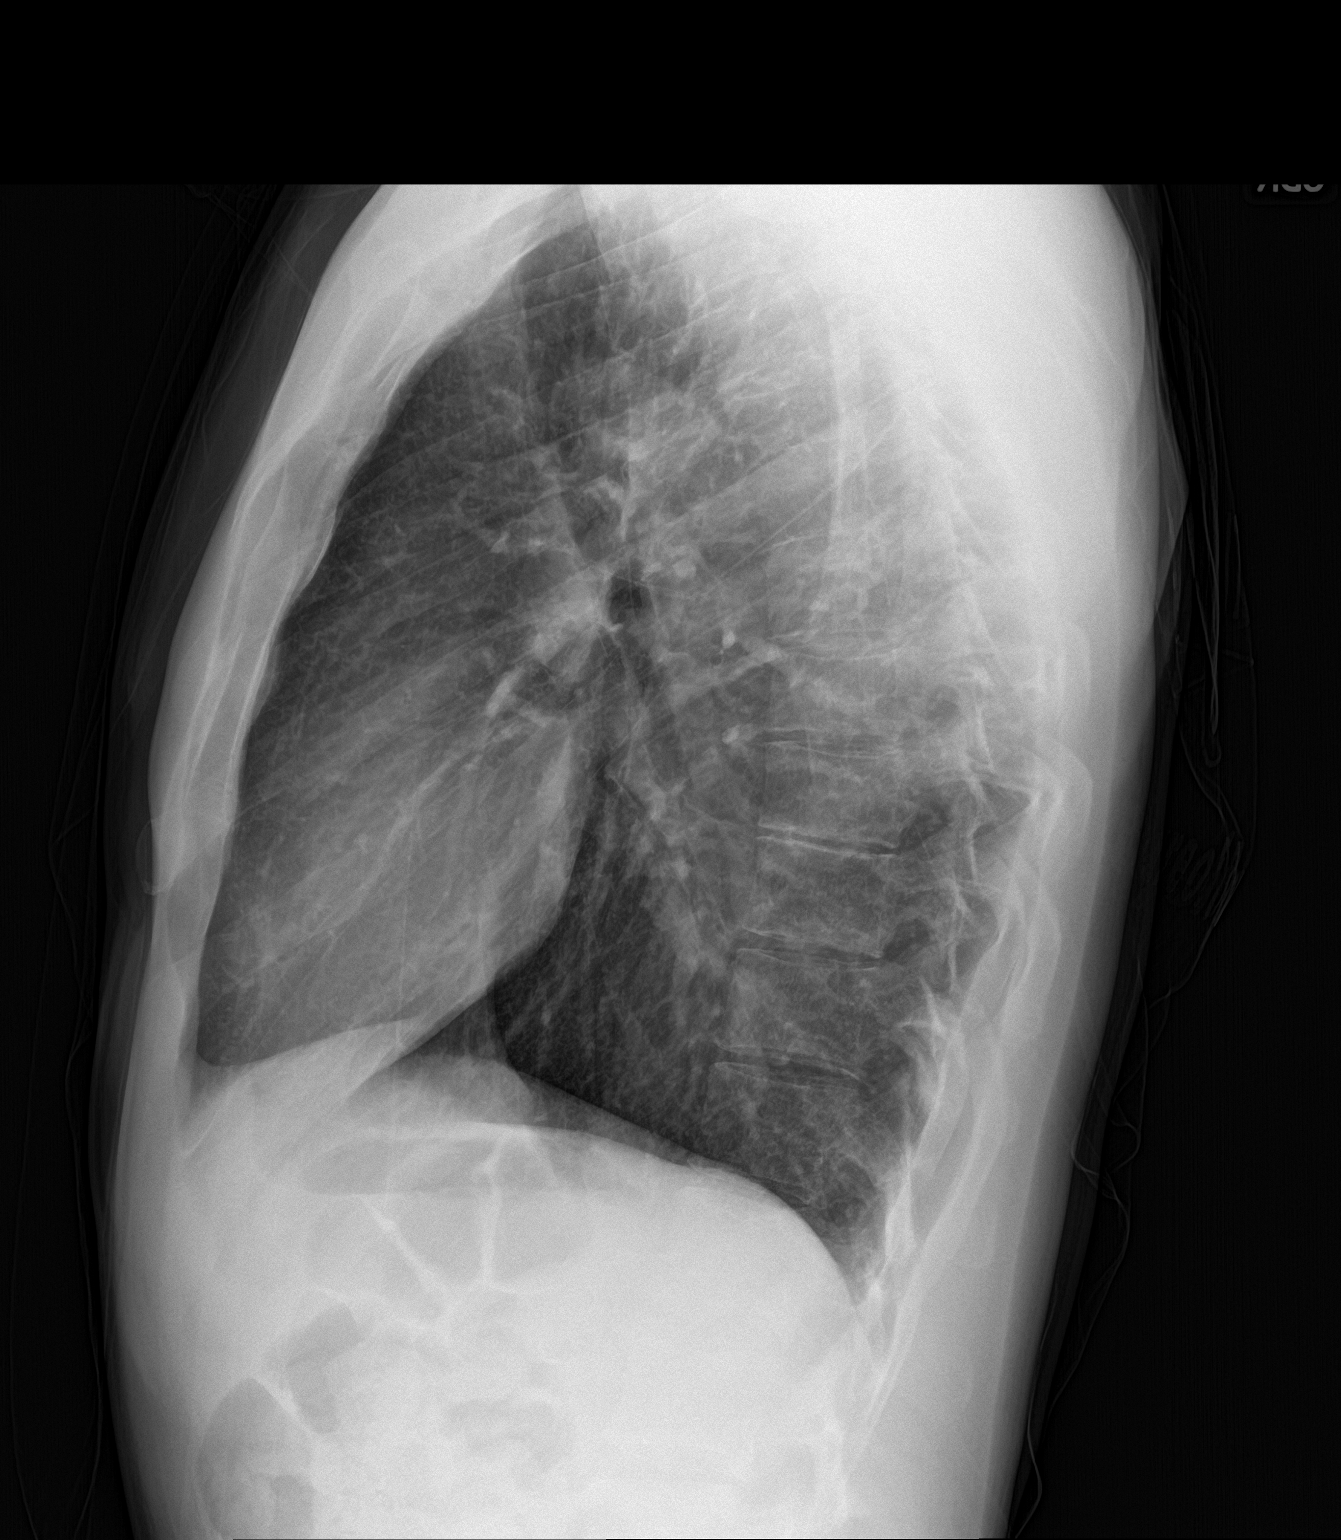

[2 of 2 positions shown; findings below may reference images not displayed]

FINDINGS: The heart size and mediastinal contours are within normal limits.
Both lungs are clear. The visualized skeletal structures are
unremarkable.
IMPRESSION: No active cardiopulmonary disease.

## 2019-01-13 IMAGING — US US ABDOMEN LIMITED
1 series · 14 of 25 positions shown · non-contrast
Comparison: None.

CLINICAL DATA: Postprandial right upper quadrant abdominal pain for
3-4 months.

EXAM:
ULTRASOUND ABDOMEN LIMITED RIGHT UPPER QUADRANT

[Series 1: us abdomen limited · 0.13mm/px · 14 of 58 slices shown]
[im 1/58]
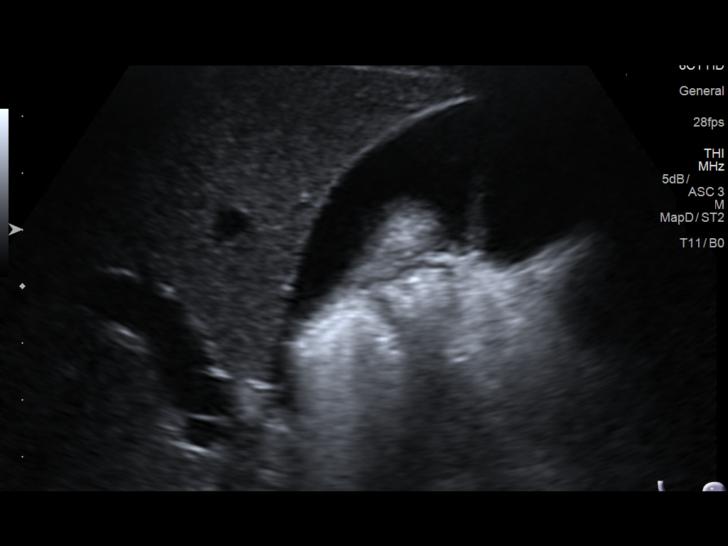
[im 5/58]
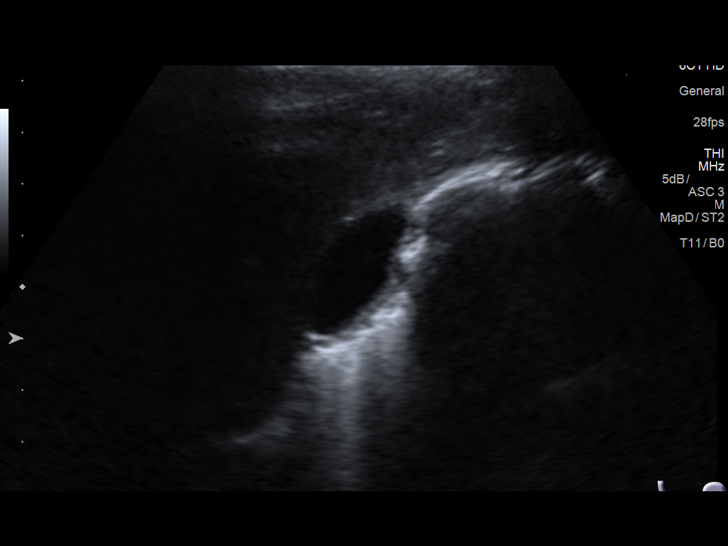
[im 10/58]
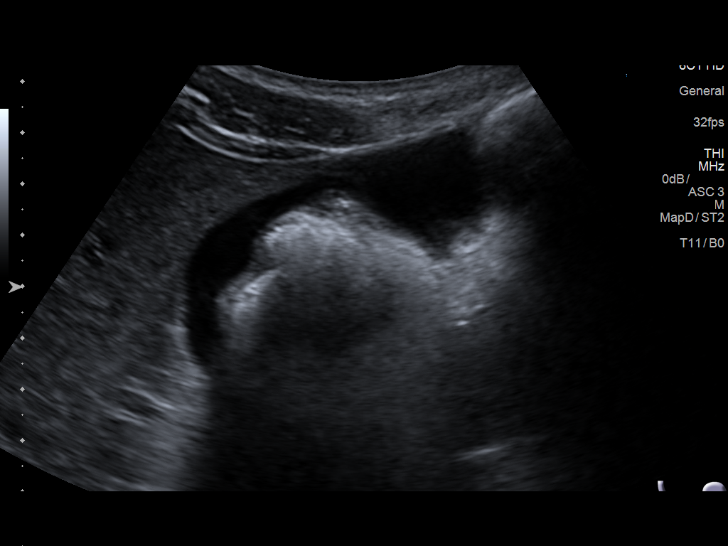
[im 15/58]
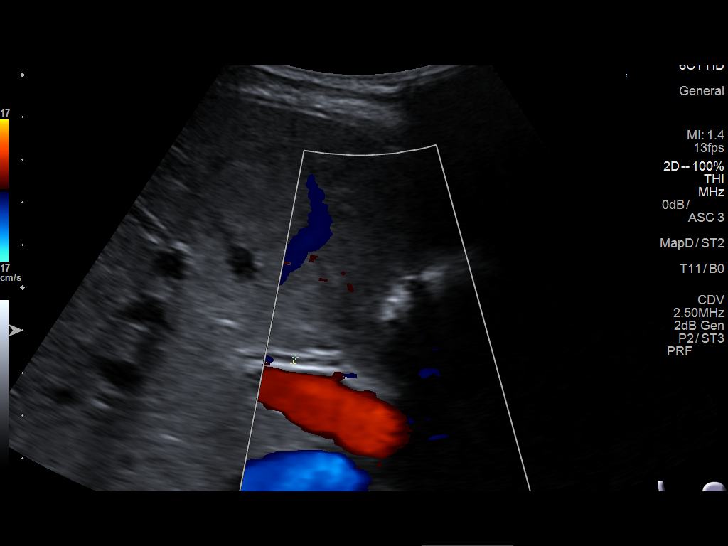
[im 20/58]
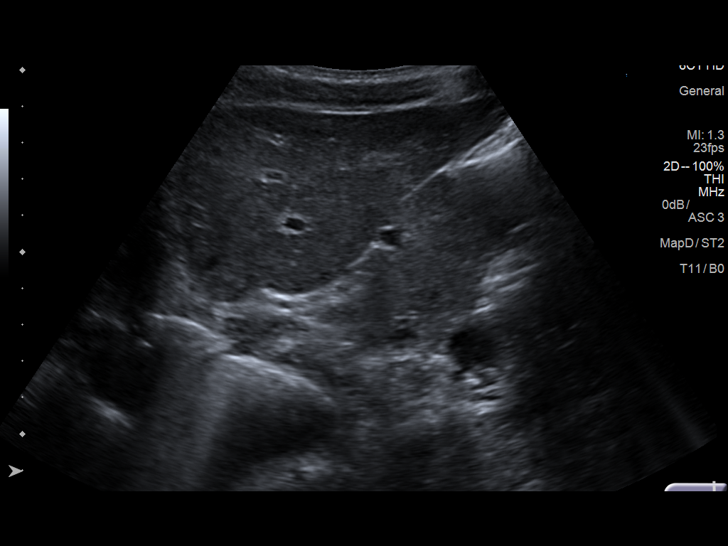
[im 22/58]
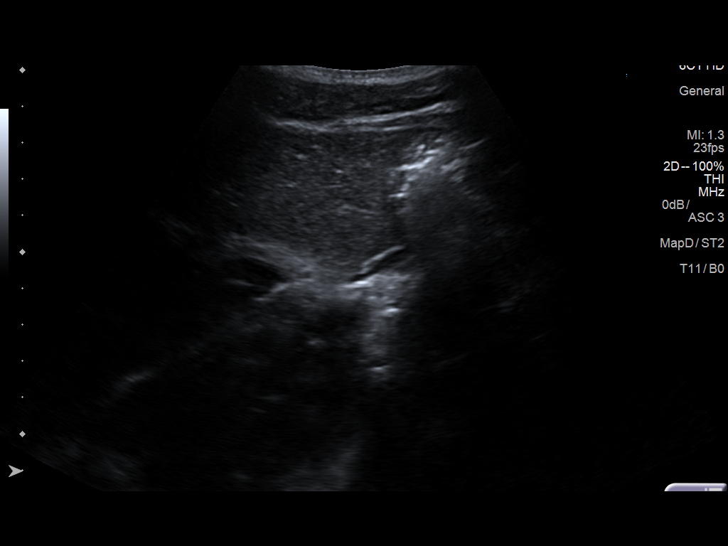
[im 27/58]
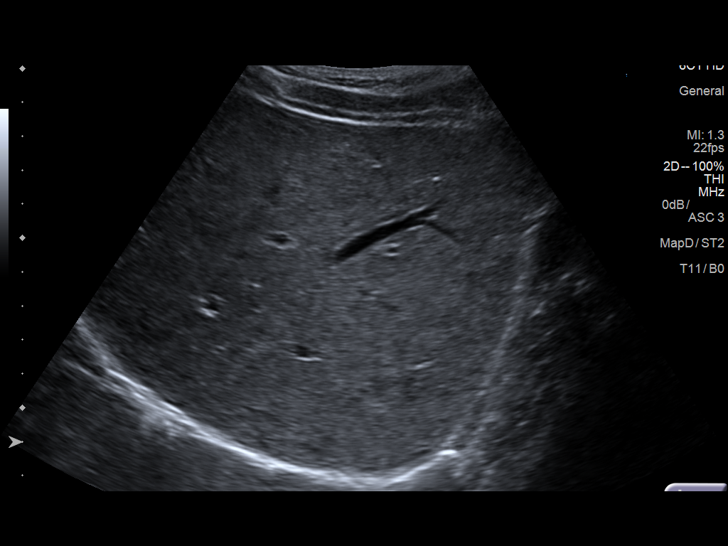
[im 31/58]
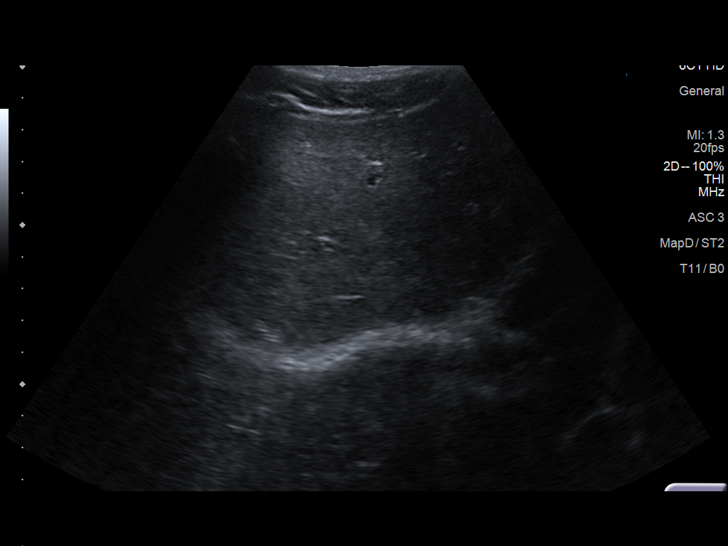
[im 36/58]
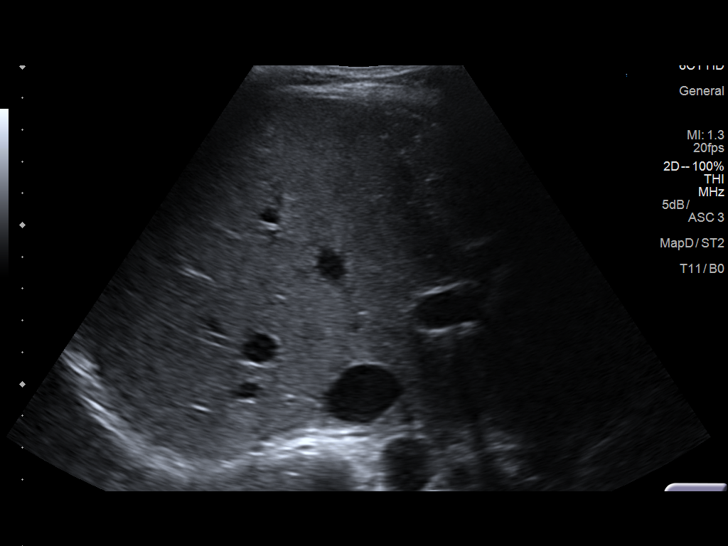
[im 39/58]
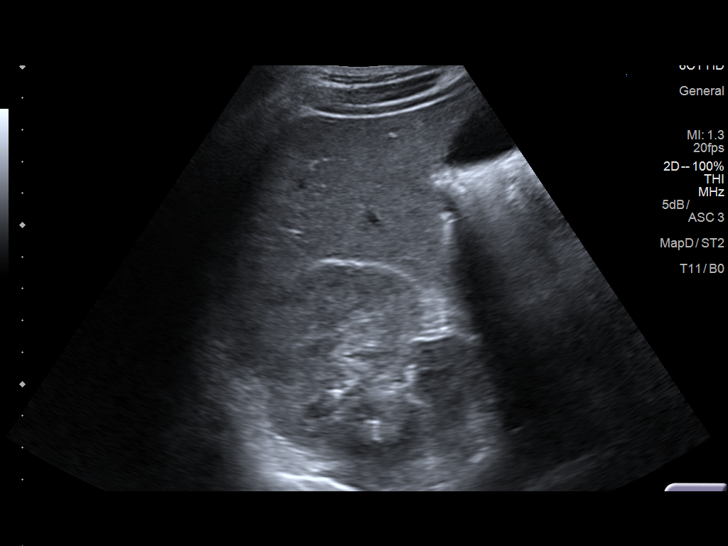
[im 43/58]
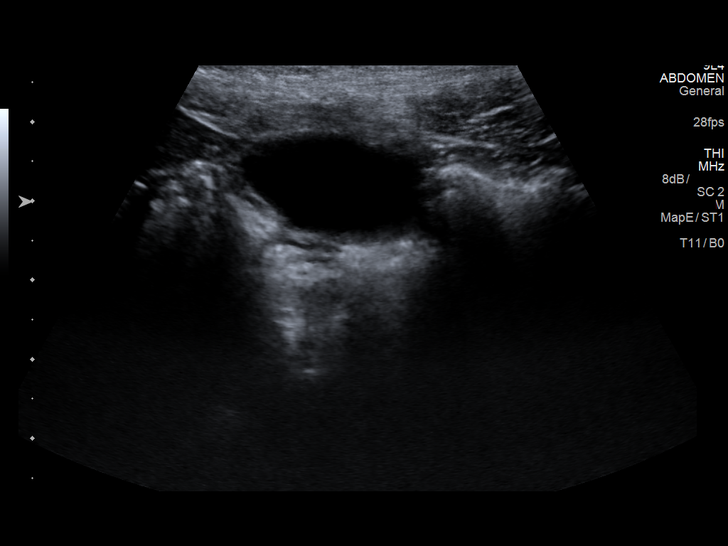
[im 48/58]
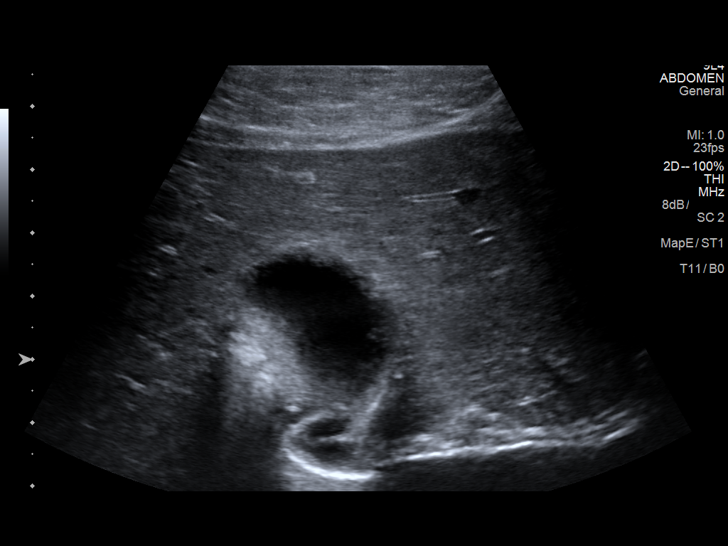
[im 53/58]
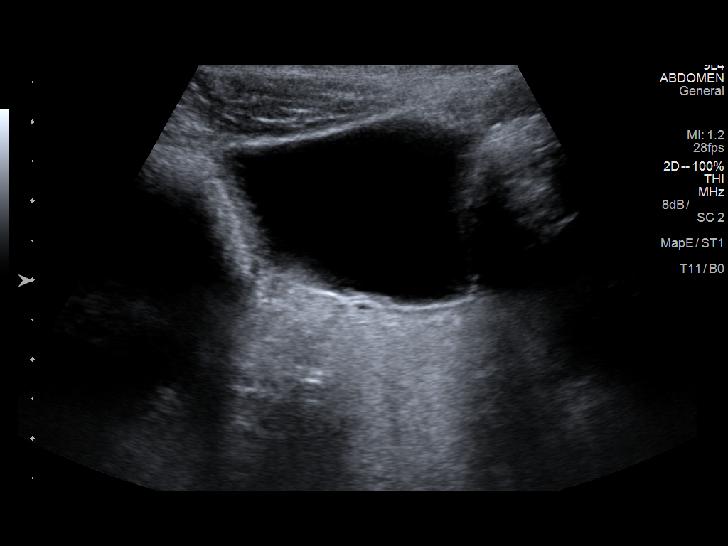
[im 58/58]
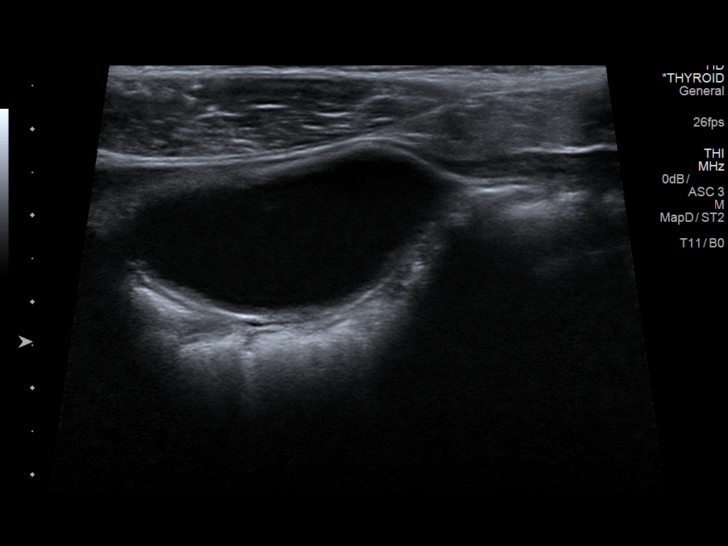

[14 of 25 positions shown; findings below may reference images not displayed]

FINDINGS: Gallbladder:

No wall thickening visualized. No sonographic Murphy sign noted by
sonographer. Normal distention of the gallbladder. Gallbladder
sludge and tiny stones are present.

Common bile duct:

Diameter: 1.6 mm

Liver:

No focal lesion identified. Within normal limits in parenchymal
echogenicity. Portal vein is patent on color Doppler imaging with
normal direction of blood flow towards the liver.
IMPRESSION: Small amount of gallbladder sludge without evidence of acute
cholecystitis.

Normal appearance of the liver.

## 2019-04-16 ENCOUNTER — Ambulatory Visit: Payer: Self-pay | Admitting: *Deleted

## 2019-04-16 NOTE — Telephone Encounter (Signed)
Pt reports vomiting, onset this AM, 6-8 times since onset. States 2 episodes of loose stools. States afebrile. Reports lower mid abdominal pain, intermittent, prior to vomiting, resolves with rest. States is vomiting "Less but normal color, not dark.", mild generalized weakness.  Pt states he was able to keep some liquids and a banana down, about an hour ago. States "may be getting better." Directed to ED if symptoms worsen, abdominal pain worsens or radiates, fever presents.  Reviewed S/S dehydration. Assured [place] would alert practice of symptoms. Pts email and phone # verified. Pt and wife (on [place]) verbalize understanding.  CB# [phone] Reason for Disposition . [1] SEVERE vomiting (e.g., 6 or more times/day) AND [2] present > 8 hours    Pt states starting to keep liquids down  Answer Assessment - Initial Assessment Questions 1. VOMITING SEVERITY: "How many times have you vomited in the past 24 hours?"     - MILD:  1 - 2 times/day    - MODERATE: 3 - 5 times/day, decreased oral intake without significant weight loss or symptoms of dehydration    - SEVERE: 6 or more times/day, vomits everything or nearly everything, with significant weight loss, symptoms of dehydration     6-8 since this AM 2. ONSET: "When did the vomiting begin?"      This am 3. FLUIDS: "What fluids or food have you vomited up today?" "Have you been able to keep any fluids down?"     Can't keep anything down 4. ABDOMINAL PAIN: "Are your having any abdominal pain?" If yes : "How bad is it and what does it feel like?" (e.g., crampy, dull, intermittent, constant)      Intermittent, sharp, lower middle abdomen 5. DIARRHEA: "Is there any diarrhea?" If so, ask: "How many times today?"      2-3 times 6. CONTACTS: "Is there anyone else in the family with the same symptoms?"      no 7. CAUSE: "What do you think is causing your vomiting?"     unsure 8. HYDRATION STATUS: "Any signs of dehydration?" (e.g., dry mouth [not only dry  lips], too weak to stand) "When did you last urinate?"     Mouth dry. Little weak. No fever, 2-3 hours ago. 9. OTHER SYMPTOMS: "Do you have any other symptoms?" (e.g., fever, headache, vertigo, vomiting blood or coffee grounds, recent head injury)    no  Protocols used: VOMITING-A-AH

## 2019-04-19 NOTE — Telephone Encounter (Signed)
Left message for patient to schedule virtual follow up appt with pcp.

## 2019-10-03 ENCOUNTER — Ambulatory Visit: Payer: No Typology Code available for payment source | Admitting: Family Medicine

## 2019-10-03 ENCOUNTER — Other Ambulatory Visit: Payer: Self-pay

## 2019-10-03 ENCOUNTER — Encounter: Payer: Self-pay | Admitting: Family Medicine

## 2019-10-03 VITALS — BP 100/64 | HR 78 | Temp 98.5°F | Resp 16 | Ht 70.0 in | Wt 135.0 lb

## 2019-10-03 DIAGNOSIS — Z131 Encounter for screening for diabetes mellitus: Secondary | ICD-10-CM | POA: Diagnosis not present

## 2019-10-03 DIAGNOSIS — Z13220 Encounter for screening for lipoid disorders: Secondary | ICD-10-CM

## 2019-10-03 DIAGNOSIS — R1011 Right upper quadrant pain: Secondary | ICD-10-CM | POA: Diagnosis not present

## 2019-10-03 DIAGNOSIS — Z130 Encounter for screening for diseases of the blood and blood-forming organs and certain disorders involving the immune mechanism: Secondary | ICD-10-CM | POA: Diagnosis not present

## 2019-10-03 NOTE — Progress Notes (Addendum)
[facility] at [facility] [address] [place], [place] [address] [phone]  Date:  10/03/2019   Name:  Cory Washington   DOB:  22-Sep-1981   MRN:  [id number]  PCP:  Pearline Cables, MD    Chief Complaint: Abdominal Pain (same issue ) and Trouble Swallowing   History of Present Illness:  Cory Washington is a 38 y.o. very pleasant male patient who presents with the following:  Generally healthy young man here today with concern of stomach problems I last saw him about 2 years ago with concern of postprandial pain-right upper quadrant ultrasound at that time showed a small amount of gallbladder sludge, otherwise okay Right upper quadrant ultrasound, December 2018: IMPRESSION: Small amount of gallbladder sludge without evidence of acute cholecystitis. Normal appearance of the liver. At that time his wife was pregnant after 2 miscarriages and they were hoping for success  Most recent labs April 2018 Flu shot- he declines  Tdap done in 2008- he declines a booster today   I had referred him to general surgery but he did not end up being seen He notes that he continues to have this pain- generally after eating This may occur a couple of times a day.  Feels like a dull pain, but sometimes more severe  Dairy foods seem to bother him the most  He is sometimes not hungry Fatty foods can exacerbate his sx  No vomiting or diarrhea  He also notes that foods may get stuck "slightly" when he swallows.  Liquids do not get stuck- just food It does not hurt to swallow He may rarely bring food back up or have an acid taste in his mouth  Wt Readings from Last 3 Encounters:  10/03/19 135 lb (61.2 kg)  03/23/18 145 lb 2 oz (65.8 kg)  11/16/17 139 lb 6.4 oz (63.2 kg)   They have a son who is 30.58 years old.  They have a 74.39 year old as well   Patient Active Problem List   Diagnosis Date Noted  . Health care maintenance  04/30/2015    Past Medical History:  Diagnosis Date  . Pneumothorax     History reviewed. No pertinent surgical history.  Social History   Tobacco Use  . Smoking status: Never Smoker  . Smokeless tobacco: Never Used  Substance Use Topics  . Alcohol use: No  . Drug use: No    Family History  Problem Relation Age of Onset  . Kidney disease Father   . CAD Neg Hx   . Deep vein thrombosis Neg Hx     No Known Allergies  Medication list has been reviewed and updated.  No current outpatient medications on file prior to visit.   No current facility-administered medications on file prior to visit.     Review of Systems:  As per HPI- otherwise negative. No fever or chills No CP or SOB Last severe abd pain attack a week ago   Physical Examination: Vitals:   10/03/19 1524  BP: 100/64  Pulse: 78  Resp: 16  Temp: 98.5 F (36.9 C)  SpO2: 97%   Vitals:   10/03/19 1524  Weight: 135 lb (61.2 kg)  Height: 5\' 10"  (1.778 m)   Body mass index is 19.37 kg/m. Ideal Body Weight: Weight in (lb) to have BMI = 25: 173.9  GEN: WDWN, NAD, Non-toxic, A & O x 3, slim build, looks well  HEENT: Atraumatic,  Normocephalic. Neck supple. No masses, No LAD. Ears and Nose: No external deformity. CV: RRR, No M/G/R. No JVD. No thrill. No extra heart sounds. PULM: CTA B, no wheezes, crackles, rhonchi. No retractions. No resp. distress. No accessory muscle use. ABD: S, NT, ND, +BS. No rebound. No HSM.  Belly is benign  EXTR: No c/c/e NEURO Normal gait.  PSYCH: Normally interactive. Conversant. Not depressed or anxious appearing.  Calm demeanor.  BP Readings from Last 3 Encounters:  10/03/19 100/64  03/23/18 116/68  11/16/17 110/72     Assessment and Plan: RUQ pain - Plan: US Abdomen Limited RUQ  Screening for deficiency anemia - Plan: CBC  Screening for diabetes mellitus - Plan: Comprehensive metabolic panel, Hemoglobin A1c  Screening for hyperlipidemia - Plan: Lipid  panel  Here today with persistent RUQ/ epigastric post prandial pain for 2+ years.  He was noted to have gall sludge in 2018 but did not end up seeing surgery Will repeat US and get labs- Will plan further follow- up pending labs. If any severe or persistent pain pt advised to go to ER  Declined routine labs today   Signed Lamar Blinks, MD Received his albs 10/16- message to pt  Results for orders placed or performed in visit on 10/03/19  CBC  Result Value Ref Range   WBC 7.4 4.0 - 10.5 K/uL   RBC 4.90 4.22 - 5.81 Mil/uL   Platelets 175.0 150.0 - 400.0 K/uL   Hemoglobin 14.0 13.0 - 17.0 g/dL   HCT 42.6 39.0 - 52.0 %   MCV 86.9 78.0 - 100.0 fl   MCHC 32.7 30.0 - 36.0 g/dL   RDW 13.3 11.5 - 15.5 %  Comprehensive metabolic panel  Result Value Ref Range   Sodium 136 135 - 145 mEq/L   Potassium 4.5 3.5 - 5.1 mEq/L   Chloride 100 96 - 112 mEq/L   CO2 29 19 - 32 mEq/L   Glucose, Bld 87 70 - 99 mg/dL   BUN 19 6 - 23 mg/dL   Creatinine, Ser 1.10 0.40 - 1.50 mg/dL   Total Bilirubin 0.5 0.2 - 1.2 mg/dL   Alkaline Phosphatase 66 39 - 117 U/L   AST 24 0 - 37 U/L   ALT 15 0 - 53 U/L   Total Protein 7.1 6.0 - 8.3 g/dL   Albumin 4.6 3.5 - 5.2 g/dL   Calcium 9.5 8.4 - 10.5 mg/dL   GFR 74.51 >60.00 mL/min  Hemoglobin A1c  Result Value Ref Range   Hgb A1c MFr Bld 5.8 4.6 - 6.5 %  Lipid panel  Result Value Ref Range   Cholesterol 203 (H) 0 - 200 mg/dL   Triglycerides 79.0 0.0 - 149.0 mg/dL   HDL 56.10 >39.00 mg/dL   VLDL 15.8 0.0 - 40.0 mg/dL   LDL Cholesterol 131 (H) 0 - 99 mg/dL   Total CHOL/HDL Ratio 4    NonHDL 146.79

## 2019-10-03 NOTE — Patient Instructions (Signed)
Please go to the lab for a blood draw- I will be in touch with your reports asap We will set you up for a repeat gallbladder ultrasound.  Depending on what we see, we may want to have you visit with surgery to discuss having your gallbladder out  Please let me know if any change or worsening of your symptoms in the meantime. If any very severe pain please go to the ER!

## 2019-10-04 ENCOUNTER — Encounter: Payer: Self-pay | Admitting: Family Medicine

## 2019-10-04 ENCOUNTER — Ambulatory Visit (HOSPITAL_BASED_OUTPATIENT_CLINIC_OR_DEPARTMENT_OTHER): Payer: No Typology Code available for payment source

## 2019-10-04 LAB — CBC
HCT: 42.6 % (ref 39.0–52.0)
Hemoglobin: 14 g/dL (ref 13.0–17.0)
MCHC: 32.7 g/dL (ref 30.0–36.0)
MCV: 86.9 fl (ref 78.0–100.0)
Platelets: 175 10*3/uL (ref 150.0–400.0)
RBC: 4.9 Mil/uL (ref 4.22–5.81)
RDW: 13.3 % (ref 11.5–15.5)
WBC: 7.4 10*3/uL (ref 4.0–10.5)

## 2019-10-04 LAB — COMPREHENSIVE METABOLIC PANEL
ALT: 15 U/L (ref 0–53)
AST: 24 U/L (ref 0–37)
Albumin: 4.6 g/dL (ref 3.5–5.2)
Alkaline Phosphatase: 66 U/L (ref 39–117)
BUN: 19 mg/dL (ref 6–23)
CO2: 29 mEq/L (ref 19–32)
Calcium: 9.5 mg/dL (ref 8.4–10.5)
Chloride: 100 mEq/L (ref 96–112)
Creatinine, Ser: 1.1 mg/dL (ref 0.40–1.50)
GFR: 74.51 mL/min (ref >60.00)
Glucose, Bld: 87 mg/dL (ref 70–99)
Potassium: 4.5 mEq/L (ref 3.5–5.1)
Sodium: 136 mEq/L (ref 135–145)
Total Bilirubin: 0.5 mg/dL (ref 0.2–1.2)
Total Protein: 7.1 g/dL (ref 6.0–8.3)

## 2019-10-04 LAB — LIPID PANEL
Cholesterol: 203 mg/dL — ABNORMAL HIGH (ref 0–200)
HDL: 56.1 mg/dL (ref >39.00)
LDL Cholesterol: 131 mg/dL — ABNORMAL HIGH (ref 0–99)
NonHDL: 146.79
Total CHOL/HDL Ratio: 4
Triglycerides: 79 mg/dL (ref 0.0–149.0)
VLDL: 15.8 mg/dL (ref 0.0–40.0)

## 2019-10-04 LAB — HEMOGLOBIN A1C: Hgb A1c MFr Bld: 5.8 % (ref 4.6–6.5)

## 2019-10-07 ENCOUNTER — Encounter: Payer: 59 | Admitting: Family Medicine

## 2019-10-08 ENCOUNTER — Ambulatory Visit (HOSPITAL_BASED_OUTPATIENT_CLINIC_OR_DEPARTMENT_OTHER): Payer: No Typology Code available for payment source

## 2019-10-10 ENCOUNTER — Other Ambulatory Visit: Payer: Self-pay

## 2019-10-10 ENCOUNTER — Ambulatory Visit (HOSPITAL_BASED_OUTPATIENT_CLINIC_OR_DEPARTMENT_OTHER)
Admission: RE | Admit: 2019-10-10 | Discharge: 2019-10-10 | Disposition: A | Discharge status: Home / Self Care | Payer: No Typology Code available for payment source | Source: Ambulatory Visit | Attending: Family Medicine | Admitting: Family Medicine

## 2019-10-10 DIAGNOSIS — R1011 Right upper quadrant pain: Secondary | ICD-10-CM | POA: Diagnosis present

## 2019-10-12 ENCOUNTER — Encounter: Payer: Self-pay | Admitting: Family Medicine

## 2019-10-12 DIAGNOSIS — R1011 Right upper quadrant pain: Secondary | ICD-10-CM

## 2019-10-16 ENCOUNTER — Encounter: Payer: Self-pay | Admitting: Gastroenterology

## 2019-10-28 ENCOUNTER — Encounter: Payer: Self-pay | Admitting: Gastroenterology

## 2019-10-28 ENCOUNTER — Ambulatory Visit (INDEPENDENT_AMBULATORY_CARE_PROVIDER_SITE_OTHER): Payer: No Typology Code available for payment source | Admitting: Gastroenterology

## 2019-10-28 VITALS — BP 112/64 | HR 76 | Temp 97.0°F | Ht 70.0 in | Wt 136.8 lb

## 2019-10-28 DIAGNOSIS — R630 Anorexia: Secondary | ICD-10-CM | POA: Insufficient documentation

## 2019-10-28 DIAGNOSIS — R1319 Other dysphagia: Secondary | ICD-10-CM | POA: Diagnosis not present

## 2019-10-28 DIAGNOSIS — R1013 Epigastric pain: Secondary | ICD-10-CM | POA: Diagnosis not present

## 2019-10-28 DIAGNOSIS — Z1159 Encounter for screening for other viral diseases: Secondary | ICD-10-CM | POA: Insufficient documentation

## 2019-10-28 NOTE — Progress Notes (Signed)
9    10/28/2019 [place] [id number] 09/15/1981   HISTORY OF PRESENT ILLNESS: This is a very pleasant 38 year-old male who is here today with complaints of epigastric abdominal discomfort.  He says it has been going on for a couple of years.  He says the it always seems to be uncomfortable, but for the most part he just ignores it until it becomes more prominent again for one reason or another.  Usually worsens with eating bigger meals at times and specifically with dairy type products.  He tries to avoid dairy products for the most part, but when he eats certain things, not sure if they are made with cream, etc.  His pain is mostly in the epigastrium with occasional right upper quadrant discomfort.  Ultrasound showed gallbladder sludge and a small hemangioma in the liver.  He admits to only occasional acid reflux.  He says that his appetite is up and down, but he denies any weight loss.  For the most part he moves his bowels regularly if anything a little bit more on the constipated side but says  that he goes almost every day or at least every other day.  Denies any blood in his stools or black stools.  No nausea, vomiting, fever, chills.  CBC and CMP within normal limits.  He does not take any medications on a regular basis and rarely uses anything as needed.  He admits to occasional feeling that a pill will get stuck in his throat for example he took a vitamin D pill the other day and it felt like maybe that got hung up.  Sometimes he will feel like food gets hung, but nothing severe.  Referred by his PCP, Dr. Lorelei Pont.    Past Medical History:  Diagnosis Date  . Pneumothorax    No past surgical history on file.  reports that he has never smoked. He has never used smokeless tobacco. He reports that he does not drink alcohol or use drugs. family history includes Kidney disease in his father. No Known Allergies    No outpatient encounter medications on file as of 10/28/2019.   No  facility-administered encounter medications on file as of 10/28/2019.      REVIEW OF SYSTEMS  : All other systems reviewed and negative except where noted in the History of Present Illness.   PHYSICAL EXAM: BP 112/64   Pulse 76   Temp (!) 97 F (36.1 C)   Ht 5\' 10"  (1.778 m)   Wt 136 lb 12.8 oz (62.1 kg)   SpO2 98%   BMI 19.63 kg/m  General: Well developed male in no acute distress Head: Normocephalic and atraumatic Eyes:  Sclerae anicteric, conjunctiva pink. Ears: Normal auditory acuity Lungs: Clear throughout to auscultation; no increased WOB. Heart: Regular rate and rhythm; no M/R/G. Abdomen: Soft, non-distended.  BS present.  Non-tender. Musculoskeletal: Symmetrical with no gross deformities  Skin: No lesions on visible extremities Extremities: No edema  Neurological: Alert oriented x 4, grossly non-focal Psychological:  Alert and cooperative. Normal mood and affect  ASSESSMENT AND PLAN: *Epigastric abdominal discomfort, occasional reflux, mild dysphagia, decrease in appetite, bloating/gas.  Sounds like he may be lactose intolerant.  I wonder if a lot of his symptoms are reflux induced although he admits to only very occasional reflux.  Ultrasound shows gallbladder sludge.  Not sure how much that is really playing into any of his symptoms.  We will start with an endoscopy for evaluation.  Will schedule with Dr.  Christella Hartigan.  Discussed lactose-free diet, which he does for the most part anyways.  If endoscopy is unremarkable and symptoms continue then could consider CT scan.  **The risks, benefits, and alternatives were discussed with the patient and they consent to proceed.       CC:  Copland, Gwenlyn Found, MD

## 2019-10-28 NOTE — Patient Instructions (Signed)
You have been scheduled for an endoscopy. Please follow written instructions given to you at your visit today. If you use inhalers (even only as needed), please bring them with you on the day of your procedure.   If you are age 39 or older, your body mass index should be between 23-30. Your Body mass index is 19.63 kg/m. If this is out of the aforementioned range listed, please consider follow up with your Primary Care Provider.  If you are age 103 or younger, your body mass index should be between 19-25. Your Body mass index is 19.63 kg/m. If this is out of the aformentioned range listed, please consider follow up with your Primary Care Provider.    I appreciate the  opportunity to care for you  Thank You   Janett Billow Zehr,PA-C

## 2019-10-29 ENCOUNTER — Telehealth: Payer: Self-pay | Admitting: Gastroenterology

## 2019-10-29 NOTE — Telephone Encounter (Signed)
Pt's wife Judson Roch rescheduled EGD.  Please reschedule COVID test.

## 2019-10-29 NOTE — Progress Notes (Signed)
I agree with the above note, plan 

## 2019-10-29 NOTE — Telephone Encounter (Signed)
COVID TEST Rescheduled to 11/12 at 2pm  Left message with patient

## 2019-11-04 ENCOUNTER — Encounter: Payer: No Typology Code available for payment source | Admitting: Gastroenterology

## 2019-11-06 ENCOUNTER — Encounter: Payer: Self-pay | Admitting: Gastroenterology

## 2019-11-06 ENCOUNTER — Other Ambulatory Visit: Payer: Self-pay | Admitting: Gastroenterology

## 2019-11-06 ENCOUNTER — Ambulatory Visit (INDEPENDENT_AMBULATORY_CARE_PROVIDER_SITE_OTHER): Payer: No Typology Code available for payment source

## 2019-11-06 DIAGNOSIS — Z1159 Encounter for screening for other viral diseases: Secondary | ICD-10-CM

## 2019-11-07 LAB — SARS CORONAVIRUS 2 (TAT 6-24 HRS): SARS Coronavirus 2: NEGATIVE

## 2019-11-08 ENCOUNTER — Encounter: Payer: No Typology Code available for payment source | Admitting: Gastroenterology

## 2019-11-08 ENCOUNTER — Ambulatory Visit (AMBULATORY_SURGERY_CENTER): Payer: No Typology Code available for payment source | Admitting: Gastroenterology

## 2019-11-08 ENCOUNTER — Other Ambulatory Visit: Payer: Self-pay

## 2019-11-08 ENCOUNTER — Encounter: Payer: Self-pay | Admitting: Gastroenterology

## 2019-11-08 VITALS — BP 90/61 | HR 66 | Temp 97.5°F | Resp 14 | Ht 70.0 in | Wt 136.0 lb

## 2019-11-08 DIAGNOSIS — K2950 Unspecified chronic gastritis without bleeding: Secondary | ICD-10-CM

## 2019-11-08 DIAGNOSIS — R1013 Epigastric pain: Secondary | ICD-10-CM

## 2019-11-08 DIAGNOSIS — K2100 Gastro-esophageal reflux disease with esophagitis, without bleeding: Secondary | ICD-10-CM

## 2019-11-08 DIAGNOSIS — K219 Gastro-esophageal reflux disease without esophagitis: Secondary | ICD-10-CM | POA: Diagnosis not present

## 2019-11-08 MED ORDER — SODIUM CHLORIDE 0.9 % IV SOLN: 500.0000 mL | INTRAVENOUS | Status: DC

## 2019-11-08 NOTE — Op Note (Signed)
[place] Patient Name: Cory Washington Procedure Date: 11/08/2019 9:30 AM MRN: [id number] Endoscopist: Milus Banister , MD Age: 38 Referring MD:  Date of Birth: [birth year] Gender: Male Account #: [id number] Procedure:                Upper GI endoscopy Indications:              Epigastric abdominal pain, Dysphagia Medicines:                Monitored Anesthesia Care Procedure:                Pre-Anesthesia Assessment:                           - Prior to the procedure, a History and Physical                            was performed, and patient medications and                            allergies were reviewed. The patient's tolerance of                            previous anesthesia was also reviewed. The risks                            and benefits of the procedure and the sedation                            options and risks were discussed with the patient.                            All questions were answered, and informed consent                            was obtained. Prior Anticoagulants: The patient has                            taken no previous anticoagulant or antiplatelet                            agents. ASA Grade Assessment: II - A patient with                            mild systemic disease. After reviewing the risks                            and benefits, the patient was deemed in                            satisfactory condition to undergo the procedure.                           After obtaining informed consent, the endoscope was  passed under direct vision. Throughout the                            procedure, the patient's blood pressure, pulse, and                            oxygen saturations were monitored continuously. The                            Endoscope was introduced through the mouth, and                            advanced to the second part of duodenum. The upper                            GI endoscopy was  accomplished without difficulty.                            The patient tolerated the procedure well. Scope In: Scope Out: Findings:                 Mild inflammation characterized by erythema was                            found in the gastric body and in the gastric                            antrum. Biopsies were taken with a cold forceps for                            histology.                           The esophagus and GE junction were normal. The                            esophagus was biopsied; distal and proximal in                            separate jars.                           The exam was otherwise without abnormality. Complications:            No immediate complications. Estimated blood loss:                            None. Estimated Blood Loss:     Estimated blood loss: none. Impression:               - Mild gastritis; biopsied to check for H. pylori.                           - Esophagus was normal; biopsied to check for  Eosinophilic Esophagitis.                           - The examination was otherwise normal. Recommendation:           - Patient has a contact number available for                            emergencies. The signs and symptoms of potential                            delayed complications were discussed with the                            patient. Return to normal activities tomorrow.                            Written discharge instructions were provided to the                            patient.                           - Resume previous diet.                           - Continue present medications.                           - Await pathology results. Rachael Feeaniel P Jacobs, MD 11/08/2019 9:52:01 AM This report has been signed electronically.

## 2019-11-08 NOTE — Patient Instructions (Signed)
Biopsies taken today. You will receive a result letter in your mail in 2-3 weeks. Gastritis seen today-handout given. Resume current medications today. Call us with any questions or concerns. Thank you!   YOU HAD AN ENDOSCOPIC PROCEDURE TODAY AT [facility]:   Refer to the procedure report that was given to you for any specific questions about what was found during the examination.  If the procedure report does not answer your questions, please call your gastroenterologist to clarify.  If you requested that your care partner not be given the details of your procedure findings, then the procedure report has been included in a sealed envelope for you to review at your convenience later.  YOU SHOULD EXPECT: Some feelings of bloating in the abdomen. Passage of more gas than usual.  Walking can help get rid of the air that was put into your GI tract during the procedure and reduce the bloating. If you had a lower endoscopy (such as a colonoscopy or flexible sigmoidoscopy) you may notice spotting of blood in your stool or on the toilet paper. If you underwent a bowel prep for your procedure, you may not have a normal bowel movement for a few days.  Please Note:  You might notice some irritation and congestion in your nose or some drainage.  This is from the oxygen used during your procedure.  There is no need for concern and it should clear up in a day or so.  SYMPTOMS TO REPORT IMMEDIATELY:    Following upper endoscopy (EGD)  Vomiting of blood or coffee ground material  New chest pain or pain under the shoulder blades  Painful or persistently difficult swallowing  New shortness of breath  Fever of 100F or higher  Black, tarry-looking stools  For urgent or emergent issues, a gastroenterologist can be reached at any hour by calling [phone].   DIET:  We do recommend a small meal at first, but then you may proceed to your regular diet.  Drink plenty of fluids but you should  avoid alcoholic beverages for 24 hours.  ACTIVITY:  You should plan to take it easy for the rest of today and you should NOT DRIVE or use heavy machinery until tomorrow (because of the sedation medicines used during the test).    FOLLOW UP: Our staff will call the number listed on your records 48-72 hours following your procedure to check on you and address any questions or concerns that you may have regarding the information given to you following your procedure. If we do not reach you, we will leave a message.  We will attempt to reach you two times.  During this call, we will ask if you have developed any symptoms of COVID 19. If you develop any symptoms (ie: fever, flu-like symptoms, shortness of breath, cough etc.) before then, please call [phone].  If you test positive for Covid 19 in the 2 weeks post procedure, please call and report this information to [place].    If any biopsies were taken you will be contacted by phone or by letter within the next 1-3 weeks.  Please call us at [phone] if you have not heard about the biopsies in 3 weeks.    SIGNATURES/CONFIDENTIALITY: You and/or your care partner have signed paperwork which will be entered into your electronic medical record.  These signatures attest to the fact that that the information above on your After Visit Summary has been reviewed and is understood.  Full responsibility of  the confidentiality of this discharge information lies with you and/or your care-partner. 

## 2019-11-08 NOTE — Progress Notes (Signed)
Report to PACU, RN, vss, BBS= Clear.  

## 2019-11-08 NOTE — Progress Notes (Signed)
Josh Monday CRNA assessed pt in recovery room due to low BP.

## 2019-11-08 NOTE — Progress Notes (Signed)
Called to room to assist during endoscopic procedure.  Patient ID and intended procedure confirmed with present staff. Received instructions for my participation in the procedure from the performing physician.  

## 2019-11-08 NOTE — Progress Notes (Signed)
Temp JB V/s CW  

## 2019-11-11 ENCOUNTER — Encounter: Payer: No Typology Code available for payment source | Admitting: Gastroenterology

## 2019-11-12 ENCOUNTER — Telehealth: Payer: Self-pay | Admitting: *Deleted

## 2019-11-12 ENCOUNTER — Telehealth: Payer: Self-pay

## 2019-11-12 NOTE — Telephone Encounter (Signed)
  Follow up Call-  Call back number 11/08/2019  Post procedure Call Back phone  # [phone]  Permission to leave phone message Yes  Some recent data might be hidden     Patient questions:  Message left to call us if necessary.

## 2019-11-12 NOTE — Telephone Encounter (Signed)
  Follow up Call-  Call back number 11/08/2019  Post procedure Call Back phone  # [phone]  Permission to leave phone message Yes  Some recent data might be hidden     Patient questions:  Do you have a fever, pain , or abdominal swelling? No. Pain Score  0 *  Have you tolerated food without any problems? Yes.    Have you been able to return to your normal activities? Yes.    Do you have any questions about your discharge instructions: Diet   No. Medications  No. Follow up visit  No.  Do you have questions or concerns about your Care? No.  Actions: * If pain score is 4 or above: No action needed, pain <4. 1. Have you developed a fever since your procedure? no  2.   Have you had an respiratory symptoms (SOB or cough) since your procedure? no  3.   Have you tested positive for COVID 19 since your procedure no  4.   Have you had any family members/close contacts diagnosed with the COVID 19 since your procedure?  no   If yes to any of these questions please route to Joylene John, RN and Alphonsa Gin, Therapist, sports.

## 2019-11-16 ENCOUNTER — Encounter: Payer: Self-pay | Admitting: Gastroenterology

## 2020-06-29 IMAGING — US US ABDOMEN LIMITED
1 series · 14 of 25 positions shown · non-contrast
Comparison: Prior ultrasound from 11/22/2017.

CLINICAL DATA: Initial evaluation for intermittent right upper
quadrant pain for several years.

EXAM:
ULTRASOUND ABDOMEN LIMITED RIGHT UPPER QUADRANT

[Series 1: us abdomen limited · 14 of 57 slices shown]
[im 1/57]
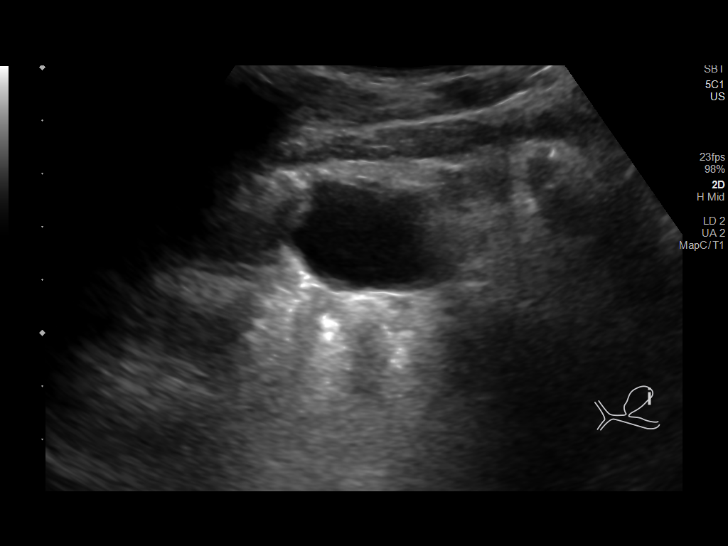
[im 5/57]
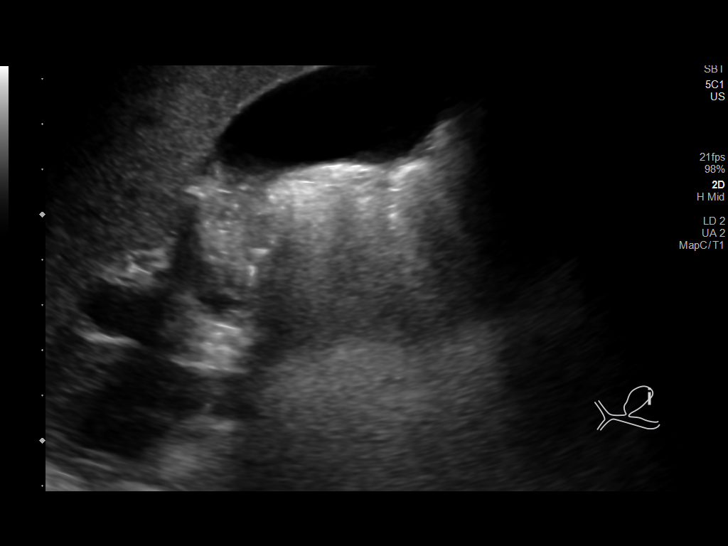
[im 10/57]
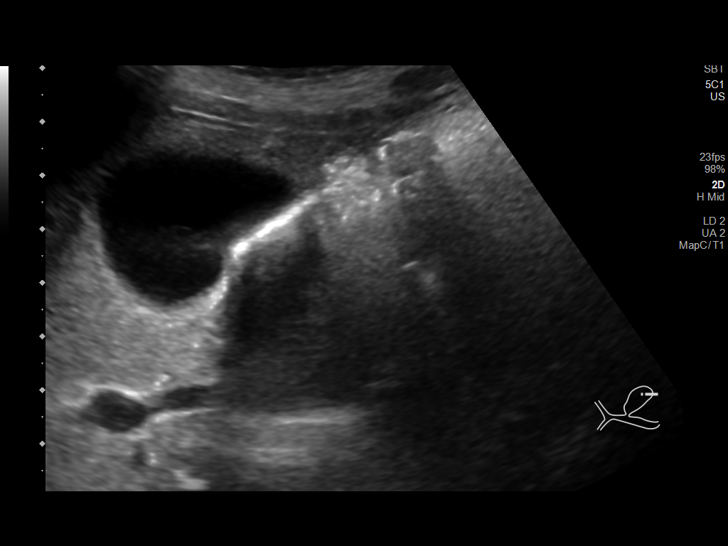
[im 15/57]
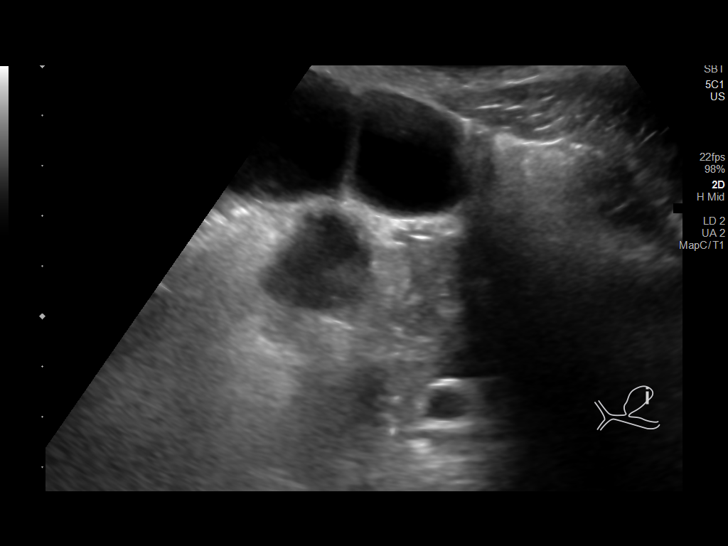
[im 19/57]
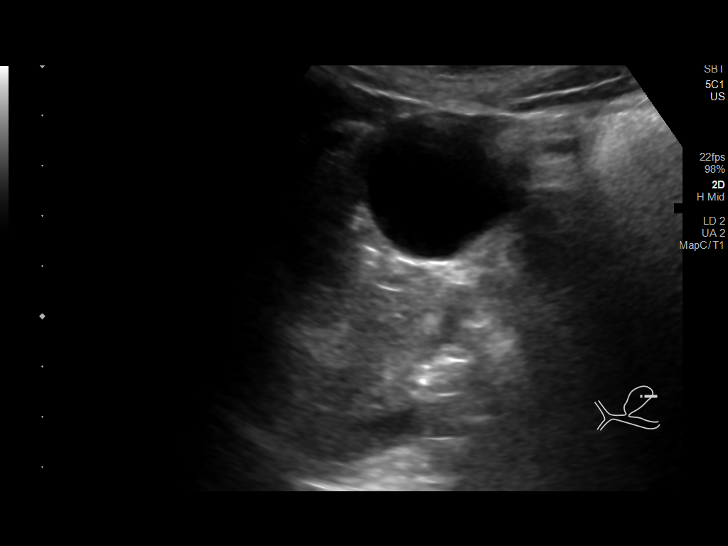
[im 22/57]
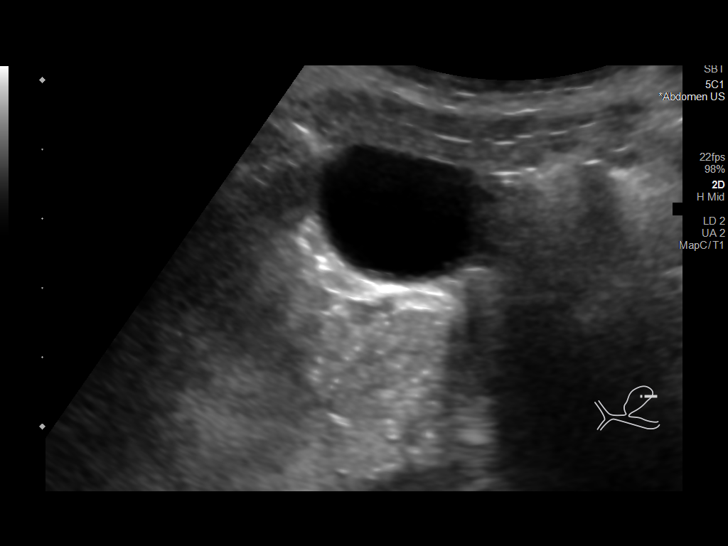
[im 26/57]
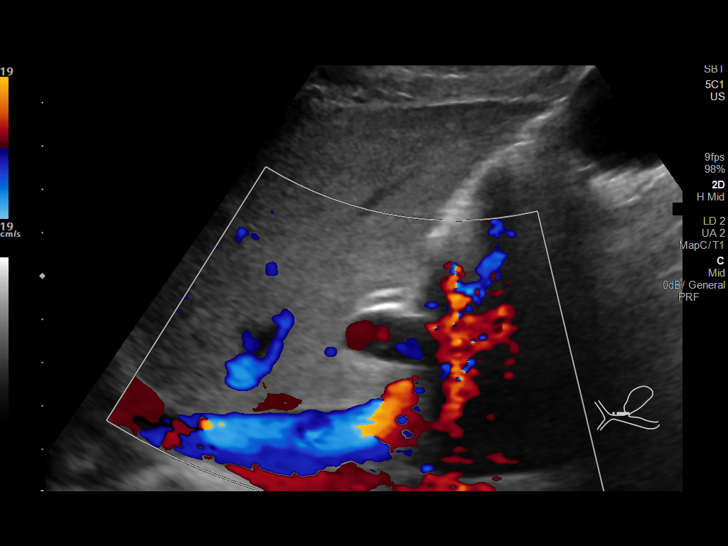
[im 31/57]
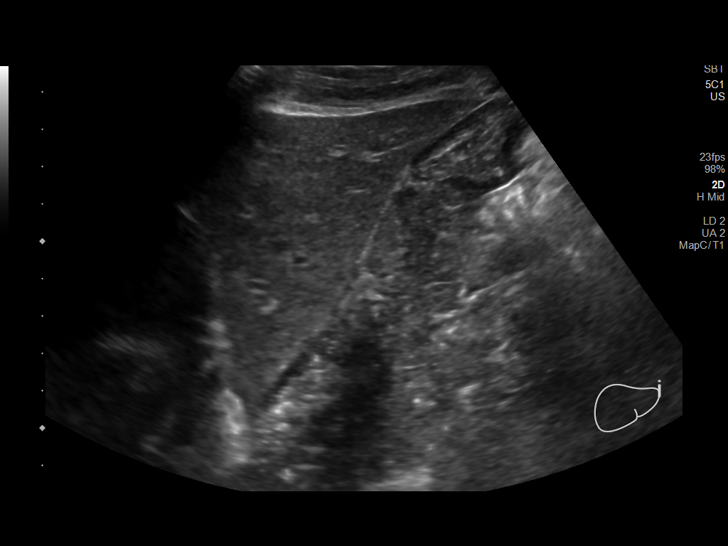
[im 36/57]
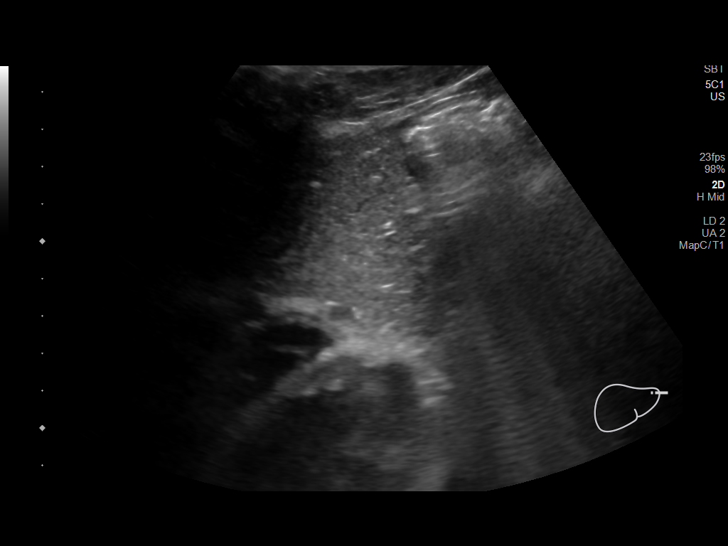
[im 38/57]
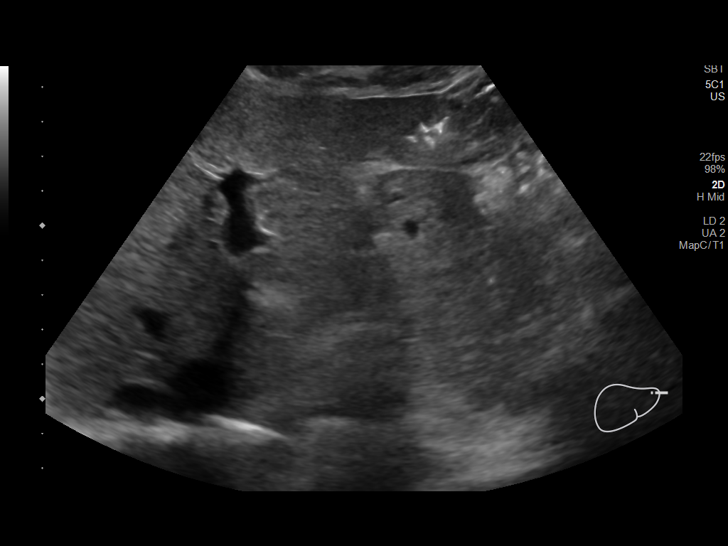
[im 43/57]
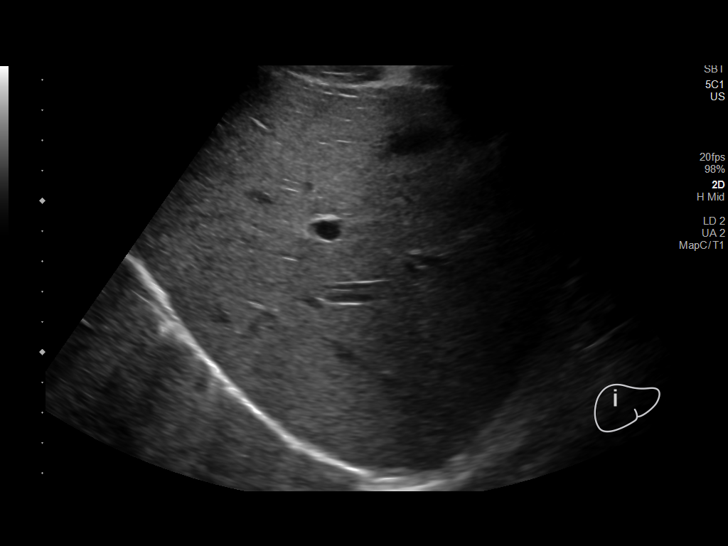
[im 47/57]
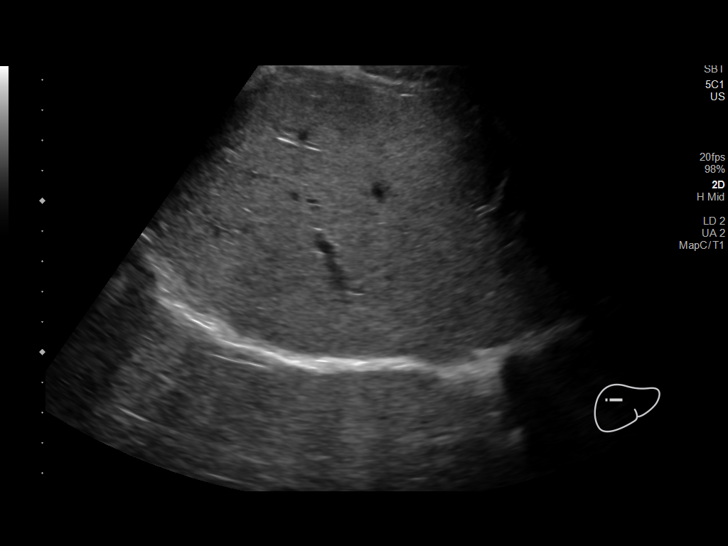
[im 52/57]
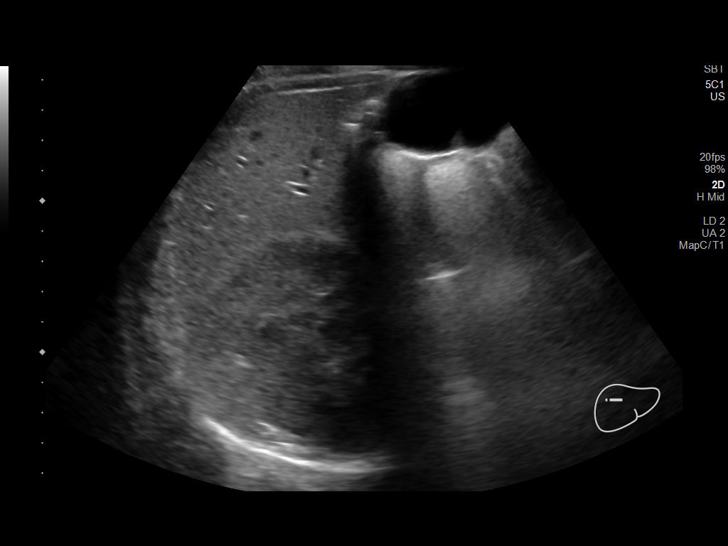
[im 57/57]
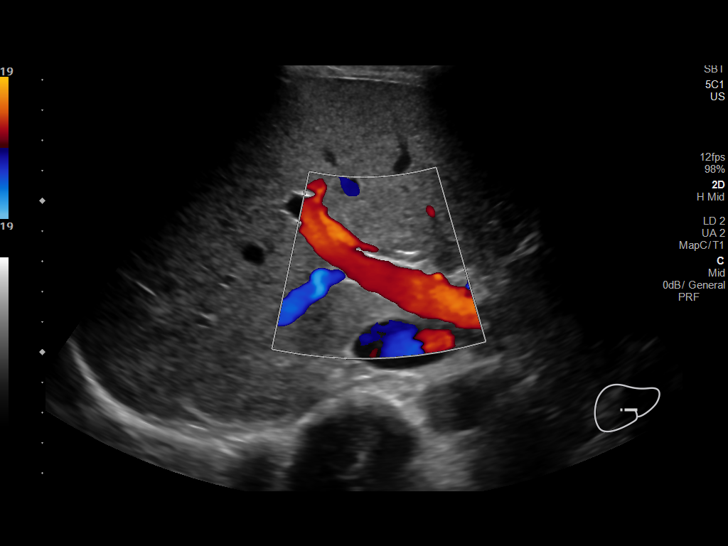

[14 of 25 positions shown; findings below may reference images not displayed]

FINDINGS: Gallbladder:

Layering nonshadowing echogenic material within the gallbladder
lumen most consistent with sludge. No frank cholelithiasis. No free
pericholecystic fluid. Gallbladder wall measures within normal
limits at 2 mm in thickness. No sonographic Murphy sign elicited on
exam.

Common bile duct:

Diameter: 1.8 mm

Liver:

1.1 x 1.1 x 1.4 cm well-circumscribed round echogenic lesion within
the right hepatic lobe noted, most consistent with a benign
hemangioma. Within normal limits in parenchymal echogenicity. Portal
vein is patent on color Doppler imaging with normal direction of
blood flow towards the liver.

Other: None.
IMPRESSION: 1. Gallbladder sludge without frank cholelithiasis or evidence for
acute cholecystitis.
2. No biliary dilatation.
3. 1.4 cm echogenic lesion within the right hepatic lobe, most
consistent with a benign hemangioma.

## 2020-09-02 NOTE — Progress Notes (Addendum)
[facility] [address], [address] [place], [place] [address] [phone]  Date:  09/03/2020   Name:  Cory Washington [id number]   DOB:  1981/04/10   MRN:  [id number]  PCP:  Pearline Cables, MD    Chief Complaint: Abdominal Pain (slight discomfort, radiating to lower abdomen)   History of Present Illness:  Cory Washington is a 39 y.o. very pleasant male patient who presents with the following:  Patient today with concern of abdominal pain Last seen by myself in October 2020-at that time he had complaint of postprandial abdominal pain, typically occurred twice a day I got her right upper quadrant ultrasound at that time which showed gallbladder sludge, referred to GI for evaluation He underwent endoscopy per Dr. Christella Hartigan November 08, 2019 Was noted to have gastritis otherwise normal-samples negative for H. Pylori No esophageal abnormality  Last week pt started to notice lower abd pain, different than what he has normally He had 4-5 hours of pain about a week ago, resolved-has not occurred since He does notice some decreased appetite   He sometimes feels like he has some dificulty coordinating his swallowing.  No pain with swallowing  He sometimes feels like he needs to drink water to get his food down  He did have a recent upper GI which showed normal esophagus, no restriction  Wt Readings from Last 3 Encounters:  09/03/20 139 lb (63 kg)  11/08/19 136 lb (61.7 kg)  10/28/19 136 lb 12.8 oz (62.1 kg)    He is married with 3 young children- his youngest is 66 weeks old! He does occasionally notice some issues with erectile function, he is not sure if he is just tired.  He does get spontaneous nighttime erections, this is not a consistent problem  Hepatitis C screening Flu vaccine- he declines today  COVID-19 vaccine- encouraged him to do this asap  Can offer routine labs, most recently done in October 2020 Patient Active  Problem List   Diagnosis Date Noted  . Abdominal pain, epigastric 10/28/2019  . Loss of appetite 10/28/2019  . Screening for viral disease 10/28/2019  . Other dysphagia 10/28/2019  . Health care maintenance 04/30/2015    Past Medical History:  Diagnosis Date  . Pneumothorax     Past Surgical History:  Procedure Laterality Date  . WISDOM TOOTH EXTRACTION      Social History   Tobacco Use  . Smoking status: Never Smoker  . Smokeless tobacco: Never Used  Substance Use Topics  . Alcohol use: No  . Drug use: No    Family History  Problem Relation Age of Onset  . Kidney disease Father   . CAD Neg Hx   . Deep vein thrombosis Neg Hx     No Known Allergies  Medication list has been reviewed and updated.  No current outpatient medications on file prior to visit.   Current Facility-Administered Medications on File Prior to Visit  Medication Dose Route Frequency Provider Last Rate Last Admin  . 0.9 %  sodium chloride infusion  500 mL Intravenous Continuous Rachael Fee, MD      . 0.9 %  sodium chloride infusion  500 mL Intravenous Continuous Rachael Fee, MD        Review of Systems:  As per HPI- otherwise negative.   Physical Examination: Vitals:   09/03/20 1445  BP: 112/70  Pulse: 74  Resp: 16  SpO2: 97%  Vitals:   09/03/20 1445  Weight: 139 lb (63 kg)  Height: 5\' 10"  (1.778 m)   Body mass index is 19.94 kg/m. Ideal Body Weight: Weight in (lb) to have BMI = 25: 173.9  GEN: no acute distress.  Thin build, looks well and his normal self HEENT: Atraumatic, Normocephalic.   Bilateral TM wnl, oropharynx normal.  PEERL,EOMI.   Ears and Nose: No external deformity. CV: RRR, No M/G/R. No JVD. No thrill. No extra heart sounds. PULM: CTA B, no wheezes, crackles, rhonchi. No retractions. No resp. distress. No accessory muscle use. ABD: S, NT, ND, +BS. No rebound. No HSM. EXTR: No c/c/e PSYCH: Normally interactive. Conversant.  Wt Readings from Last 3  Encounters:  09/03/20 139 lb (63 kg)  11/08/19 136 lb (61.7 kg)  10/28/19 136 lb 12.8 oz (62.1 kg)     Assessment and Plan: Screening for deficiency anemia - Plan: CBC  Screening for diabetes mellitus - Plan: Comprehensive metabolic panel, Hemoglobin A1c  Screening for hyperlipidemia - Plan: Lipid panel  Encounter for hepatitis C screening test for low risk patient - Plan: Hepatitis C antibody  Screening for thyroid disorder - Plan: TSH  Abdominal pain, unspecified abdominal location     Patient here today for couple concerns He had an episode of abdominal pain last week which lasted for a few hours, then resolved.  It has not come back I will check labs as above, he will let me know if the symptoms do return Routine lab work is due, ordered as above Strongly encourage patient to get his flu shot because he does have an infant at home, and also his COVID-19 vaccine.  He states he will think about it I have asked him to monitor his erectile function and swallowing symptoms, if these become more consistent or problematic he will let me know This visit occurred during the SARS-CoV-2 public health emergency.  Safety protocols were in place, including screening questions prior to the visit, additional usage of staff PPE, and extensive cleaning of exam room while observing appropriate contact time as indicated for disinfecting solutions.    Signed 13/09/20, MD Received his labs as below, 9/17  Results for orders placed or performed in visit on 09/03/20  CBC  Result Value Ref Range   WBC 4.8 3.8 - 10.8 Thousand/uL   RBC 5.02 4.20 - 5.80 Million/uL   Hemoglobin 14.3 13.2 - 17.1 g/dL   HCT 09/05/20 38 - 50 %   MCV 84.9 80.0 - 100.0 fL   MCH 28.5 27.0 - 33.0 pg   MCHC 33.6 32.0 - 36.0 g/dL   RDW 30.8 65.7 - 84.6 %   Platelets 206 140 - 400 Thousand/uL   MPV 11.3 7.5 - 12.5 fL  Comprehensive metabolic panel  Result Value Ref Range   Glucose, Bld 95 65 - 99 mg/dL   BUN 22 7 -  25 mg/dL   Creat 96.2 9.52 - 8.41 mg/dL   BUN/Creatinine Ratio NOT APPLICABLE 6 - 22 (calc)   Sodium 137 135 - 146 mmol/L   Potassium 4.3 3.5 - 5.3 mmol/L   Chloride 100 98 - 110 mmol/L   CO2 28 20 - 32 mmol/L   Calcium 9.6 8.6 - 10.3 mg/dL   Total Protein 7.0 6.1 - 8.1 g/dL   Albumin 4.6 3.6 - 5.1 g/dL   Globulin 2.4 1.9 - 3.7 g/dL (calc)   AG Ratio 1.9 1.0 - 2.5 (calc)   Total Bilirubin 0.5 0.2 - 1.2 mg/dL  Alkaline phosphatase (APISO) 73 36 - 130 U/L   AST 22 10 - 40 U/L   ALT 19 9 - 46 U/L  Hemoglobin A1c  Result Value Ref Range   Hgb A1c MFr Bld 5.3 <5.7 % of total Hgb   Mean Plasma Glucose 105 (calc)   eAG (mmol/L) 5.8 (calc)  Lipid panel  Result Value Ref Range   Cholesterol 203 (H) <200 mg/dL   HDL 61 > OR = 40 mg/dL   Triglycerides 85 <903 mg/dL   LDL Cholesterol (Calc) 124 (H) mg/dL (calc)   Total CHOL/HDL Ratio 3.3 <5.0 (calc)   Non-HDL Cholesterol (Calc) 142 (H) <130 mg/dL (calc)  Hepatitis C antibody  Result Value Ref Range   Hepatitis C Ab NON-REACTIVE NON-REACTI   SIGNAL TO CUT-OFF 0.02 <1.00  TSH  Result Value Ref Range   TSH 0.54 0.40 - 4.50 mIU/L   Message to pt

## 2020-09-02 NOTE — Patient Instructions (Addendum)
It was great to see you again today!  I will be in touch with your lab results asap  Please get your flu and covid vaccines asap!    Enjoy your new baby Let me know if any of your concerns are getting worse or more persistent

## 2020-09-03 ENCOUNTER — Other Ambulatory Visit: Payer: Self-pay

## 2020-09-03 ENCOUNTER — Ambulatory Visit: Payer: Medicaid Other | Admitting: Family Medicine

## 2020-09-03 ENCOUNTER — Encounter: Payer: Self-pay | Admitting: Family Medicine

## 2020-09-03 VITALS — BP 112/70 | HR 74 | Resp 16 | Ht 70.0 in | Wt 139.0 lb

## 2020-09-03 DIAGNOSIS — R109 Unspecified abdominal pain: Secondary | ICD-10-CM

## 2020-09-03 DIAGNOSIS — Z1159 Encounter for screening for other viral diseases: Secondary | ICD-10-CM

## 2020-09-03 DIAGNOSIS — Z13220 Encounter for screening for lipoid disorders: Secondary | ICD-10-CM

## 2020-09-03 DIAGNOSIS — Z130 Encounter for screening for diseases of the blood and blood-forming organs and certain disorders involving the immune mechanism: Secondary | ICD-10-CM | POA: Diagnosis not present

## 2020-09-03 DIAGNOSIS — Z131 Encounter for screening for diabetes mellitus: Secondary | ICD-10-CM | POA: Diagnosis not present

## 2020-09-03 DIAGNOSIS — Z1329 Encounter for screening for other suspected endocrine disorder: Secondary | ICD-10-CM

## 2020-09-04 ENCOUNTER — Encounter: Payer: Self-pay | Admitting: Family Medicine

## 2020-09-04 LAB — CBC
HCT: 42.6 % (ref 38.5–50.0)
Hemoglobin: 14.3 g/dL (ref 13.2–17.1)
MCH: 28.5 pg (ref 27.0–33.0)
MCHC: 33.6 g/dL (ref 32.0–36.0)
MCV: 84.9 fL (ref 80.0–100.0)
MPV: 11.3 fL (ref 7.5–12.5)
Platelets: 206 10*3/uL (ref 140–400)
RBC: 5.02 10*6/uL (ref 4.20–5.80)
RDW: 12.5 % (ref 11.0–15.0)
WBC: 4.8 10*3/uL (ref 3.8–10.8)

## 2020-09-04 LAB — HEMOGLOBIN A1C
Hgb A1c MFr Bld: 5.3 % of total Hgb (ref <5.7)
Mean Plasma Glucose: 105 (calc)
eAG (mmol/L): 5.8 (calc)

## 2020-09-04 LAB — LIPID PANEL
Cholesterol: 203 mg/dL — ABNORMAL HIGH (ref <200)
HDL: 61 mg/dL (ref >=40)
LDL Cholesterol (Calc): 124 mg/dL (calc) — ABNORMAL HIGH
Non-HDL Cholesterol (Calc): 142 mg/dL (calc) — ABNORMAL HIGH (ref <130)
Total CHOL/HDL Ratio: 3.3 (calc) (ref <5.0)
Triglycerides: 85 mg/dL (ref <150)

## 2020-09-04 LAB — COMPREHENSIVE METABOLIC PANEL
AG Ratio: 1.9 (calc) (ref 1.0–2.5)
ALT: 19 U/L (ref 9–46)
AST: 22 U/L (ref 10–40)
Albumin: 4.6 g/dL (ref 3.6–5.1)
Alkaline phosphatase (APISO): 73 U/L (ref 36–130)
BUN: 22 mg/dL (ref 7–25)
CO2: 28 mmol/L (ref 20–32)
Calcium: 9.6 mg/dL (ref 8.6–10.3)
Chloride: 100 mmol/L (ref 98–110)
Creat: 1.24 mg/dL (ref 0.60–1.35)
Globulin: 2.4 g/dL (calc) (ref 1.9–3.7)
Glucose, Bld: 95 mg/dL (ref 65–99)
Potassium: 4.3 mmol/L (ref 3.5–5.3)
Sodium: 137 mmol/L (ref 135–146)
Total Bilirubin: 0.5 mg/dL (ref 0.2–1.2)
Total Protein: 7 g/dL (ref 6.1–8.1)

## 2020-09-04 LAB — HEPATITIS C ANTIBODY
Hepatitis C Ab: NONREACTIVE
SIGNAL TO CUT-OFF: 0.02 (ref <1.00)

## 2020-09-04 LAB — TSH: TSH: 0.54 mIU/L (ref 0.40–4.50)

## 2021-04-08 ENCOUNTER — Encounter: Payer: Self-pay | Admitting: Family Medicine

## 2021-04-08 DIAGNOSIS — G4733 Obstructive sleep apnea (adult) (pediatric): Secondary | ICD-10-CM | POA: Insufficient documentation

## 2024-09-11 ENCOUNTER — Ambulatory Visit: Admitting: Physician Assistant

## 2024-09-11 ENCOUNTER — Encounter: Payer: Self-pay | Admitting: Physician Assistant

## 2024-09-11 VITALS — BP 110/60 | HR 74 | Temp 98.6°F | Ht 69.5 in | Wt 132.2 lb

## 2024-09-11 DIAGNOSIS — E559 Vitamin D deficiency, unspecified: Secondary | ICD-10-CM | POA: Diagnosis not present

## 2024-09-11 DIAGNOSIS — R1319 Other dysphagia: Secondary | ICD-10-CM | POA: Diagnosis not present

## 2024-09-11 DIAGNOSIS — Z0000 Encounter for general adult medical examination without abnormal findings: Secondary | ICD-10-CM

## 2024-09-11 DIAGNOSIS — E785 Hyperlipidemia, unspecified: Secondary | ICD-10-CM

## 2024-09-11 DIAGNOSIS — R251 Tremor, unspecified: Secondary | ICD-10-CM

## 2024-09-11 DIAGNOSIS — K828 Other specified diseases of gallbladder: Secondary | ICD-10-CM

## 2024-09-11 DIAGNOSIS — Z0001 Encounter for general adult medical examination with abnormal findings: Secondary | ICD-10-CM | POA: Diagnosis not present

## 2024-09-11 NOTE — Progress Notes (Signed)
 Subjective:    Cory Washington is a 43 y.o. male and is here for a comprehensive physical exam.  HPI  There are no preventive care reminders to display for this patient.  Discussed the use of AI scribe software for clinical note transcription with the patient, who gave verbal consent to proceed.  History of Present Illness   Cory Washington is a 43 year old male who presents with a tremor and swallowing difficulties.  He has a tremor in his left hand for a few years, slight and more noticeable when holding objects like a cup of water. There is no associated weakness, and he can perform tasks requiring hand strength. He has increased caffeine intake recently. No family history of neurological issues such as Parkinson's disease.  He experiences swallowing difficulties, with a sensation of food being stuck in his throat, particularly when not drinking water with meals. An endoscopy in November 2020 showed gastritis but no esophageal abnormalities. Biopsies for eosinophilic esophagitis were performed. He has not been on treatment like omeprazole.  He has gallbladder sludge identified in ultrasounds from 2018 and 2020, with occasional abdominal discomfort after high-fat meals. These episodes are rare. He was referred to a surgeon for potential gallbladder removal but did not pursue it.  He maintains a stable weight similar to his high school years, follows a whole foods diet with occasional fast food, and exercises once a week. He avoids sugary beverages, alcohol, and smoking.  He reports no significant sleep issues currently, using a nasal dilator to reduce mouth breathing. Two sleep studies were negative for sleep apnea. His family history includes kidney disease in his father and alcohol abuse in his sisters. His mother is alive with no known health issues.      Health Maintenance: Immunizations -- declined flu shot Colonoscopy -- n/a PSA -- No results found for: PSA1,  PSA Diet -- try whole foods; occasional fast foods; no sodas or juices Sleep habits -- normal sleep overall Exercise -- once a week; running  Weight -- Weight: 132 lb 4 oz (60 kg)  Recent weight history Wt Readings from Last 10 Encounters:  09/11/24 132 lb 4 oz (60 kg)  09/03/20 139 lb (63 kg)  11/08/19 136 lb (61.7 kg)  10/28/19 136 lb 12.8 oz (62.1 kg)  10/03/19 135 lb (61.2 kg)  03/23/18 145 lb 2 oz (65.8 kg)  11/16/17 139 lb 6.4 oz (63.2 kg)  03/30/17 137 lb 3.2 oz (62.2 kg)  04/30/15 135 lb (61.2 kg)   Body mass index is 19.25 kg/m.  Mood -- no major concerns Alcohol use --  reports no history of alcohol use.  Tobacco use --  Tobacco Use: Low Risk  (09/11/2024)   Patient History    Smoking Tobacco Use: Never    Smokeless Tobacco Use: Never    Passive Exposure: Not on file    Eligible for Low Dose CT? no  UTD with eye doctor? UpToDate  UTD with dentist? UpToDate      09/11/2024    2:51 PM  Depression screen PHQ 2/9  Decreased Interest 0  Down, Depressed, Hopeless 0  PHQ - 2 Score 0    Other providers/specialists: Patient Care Team: Job Lukes, [place] as PCP - General (Physician Assistant)    PMHx, SurgHx, SocialHx, Medications, and Allergies were reviewed in the Visit Navigator and updated as appropriate.   Past Medical History:  Diagnosis Date   Allergy    Pneumothorax  Past Surgical History:  Procedure Laterality Date   WISDOM TOOTH EXTRACTION       Family History  Problem Relation Age of Onset   Kidney disease Mother    Early death Mother    Alcohol abuse Mother    Alcohol abuse Father    Kidney disease Father    Alcohol abuse Sister    Alcohol abuse Sister    Alcohol abuse Maternal Grandmother    CAD Neg Hx    Deep vein thrombosis Neg Hx     Social History   Tobacco Use   Smoking status: Never   Smokeless tobacco: Never  Vaping Use   Vaping status: Never Used  Substance Use Topics   Alcohol use: No   Drug use: No     Review of Systems:   Review of Systems  Constitutional:  Negative for chills, fever, malaise/fatigue and weight loss.  HENT:  Negative for hearing loss, sinus pain and sore throat.   Respiratory:  Negative for cough and hemoptysis.   Cardiovascular:  Negative for chest pain, palpitations, leg swelling and PND.  Gastrointestinal:  Negative for abdominal pain, constipation, diarrhea, heartburn, nausea and vomiting.  Genitourinary:  Negative for dysuria, frequency and urgency.  Musculoskeletal:  Negative for back pain, myalgias and neck pain.  Skin:  Negative for itching and rash.  Neurological:  Positive for tremors. Negative for dizziness, tingling, seizures and headaches.  Endo/Heme/Allergies:  Negative for polydipsia.  Psychiatric/Behavioral:  Negative for depression. The patient is not nervous/anxious.     Objective:    Vitals:   09/11/24 1453  BP: 110/60  Pulse: 74  Temp: 98.6 F (37 C)  SpO2: 96%    Body mass index is 19.25 kg/m.  General  Alert, cooperative, no distress, appears stated age  Head:  Normocephalic, without obvious abnormality, atraumatic  Eyes:  PERRL, conjunctiva/corneas clear, EOM's intact, fundi benign, both eyes       Ears:  Normal TM's and external ear canals, both ears  Nose: Nares normal, septum midline, mucosa normal, no drainage or sinus tenderness  Throat: Lips, mucosa, and tongue normal; teeth and gums normal  Neck: Supple, symmetrical, trachea midline, no adenopathy;     thyroid :  No enlargement/tenderness/nodules; no carotid bruit or JVD  Back:   Symmetric, no curvature, ROM normal, no CVA tenderness  Lungs:   Clear to auscultation bilaterally, respirations unlabored  Chest wall:  No tenderness or deformity  Heart:  Regular rate and rhythm, S1 and S2 normal, no murmur, rub or gallop  Abdomen:   Soft, non-tender, bowel sounds active all four quadrants, no masses, no organomegaly  Extremities: Extremities normal, atraumatic, no cyanosis  or edema  Prostate : Deferred   Skin: Skin color, texture, turgor normal, no rashes or lesions  Lymph nodes: Cervical, supraclavicular, and axillary nodes normal  Neurologic: CNII-XII grossly intact. Normal strength, sensation and reflexes throughout   AssessmentPlan:   Assessment and Plan    Comprehensive Physical Exam (CPE) preventive care annual visit Today patient counseled on age appropriate routine health concerns for screening and prevention, each reviewed and up to date or declined. Immunizations reviewed and up to date or declined. Labs ordered and reviewed. Risk factors for depression reviewed and negative. Hearing function and visual acuity are intact. ADLs screened and addressed as needed. Functional ability and level of safety reviewed and appropriate. Education, counseling and referrals performed based on assessed risks today. Patient provided with a copy of personalized plan for preventive services.  Left hand  tremor Intermittent left hand tremor for five years, more noticeable than right. No weakness or family history. Possible caffeine contribution. - Check blood work including vitamin B12 levels. - Consider referral to tremor specialist if symptoms worsen.  Other Dysphagia Intermittent sensation of food being stuck in throat since 2020. Previous endoscopy showed gastritis, normal esophagus, negative for eosinophilic esophagitis. - Trial over-the-counter omeprazole for two weeks. - Consider repeat endoscopy with colonoscopy at age 42 if symptoms persist.  Gallbladder sludge Gallbladder sludge noted on ultrasounds in 2018 and 2020. Symptoms rare, not currently concerning. - Monitor symptoms. - Consider surgical consultation if symptoms become more frequent or severe.  Hypercholesterolemia Slightly elevated cholesterol levels. No current medication or treatment plan. - Update blood work to assess current cholesterol levels.     Vitamin D  deficiency  Update levels and  provide recommendations        Lucie Buttner, PA-C [facility]

## 2024-09-11 NOTE — Patient Instructions (Signed)
  VISIT SUMMARY: Today, we discussed your left hand tremor, swallowing difficulties, gallbladder sludge, and slightly elevated cholesterol levels. We have outlined a plan to address each of these issues.  YOUR PLAN: LEFT HAND TREMOR: You have had a tremor in your left hand for a few years, which is more noticeable when holding objects. There is no associated weakness, and it may be related to increased caffeine intake. -We will check your blood work, including vitamin B12 levels and thyroid . -If your symptoms worsen, we may refer you to a tremor specialist.  SWALLOWING DIFFICULTIES (DYSPHAGIA): You have been experiencing a sensation of food being stuck in your throat, especially when not drinking water with meals. Previous tests showed gastritis but no issues with your esophagus. -Try over-the-counter omeprazole/prilosec for two weeks. -If your symptoms persist, we may consider a repeat endoscopy and a colonoscopy when you turn 44.  GALLBLADDER SLUDGE: You have gallbladder sludge noted in past ultrasounds, with occasional abdominal discomfort after high-fat meals. -Monitor your symptoms. -Consider a surgical consultation if your symptoms become more frequent or severe.  SLIGHTLY ELEVATED CHOLESTEROL (HYPERCHOLESTEROLEMIA): Your cholesterol levels are slightly elevated. -We will update your blood work to assess your current cholesterol levels.                      Contains text generated by [employer]bridge.                                 Contains text generated by Abridge.

## 2024-09-12 ENCOUNTER — Ambulatory Visit: Payer: Self-pay | Admitting: Physician Assistant

## 2024-09-12 LAB — COMPREHENSIVE METABOLIC PANEL WITH GFR
ALT: 20 U/L (ref 0–53)
AST: 21 U/L (ref 0–37)
Albumin: 4.6 g/dL (ref 3.5–5.2)
Alkaline Phosphatase: 66 U/L (ref 39–117)
BUN: 23 mg/dL (ref 6–23)
CO2: 30 meq/L (ref 19–32)
Calcium: 9.7 mg/dL (ref 8.4–10.5)
Chloride: 103 meq/L (ref 96–112)
Creatinine, Ser: 0.98 mg/dL (ref 0.40–1.50)
GFR: 94.14 mL/min (ref >60.00)
Glucose, Bld: 111 mg/dL — ABNORMAL HIGH (ref 70–99)
Potassium: 4.2 meq/L (ref 3.5–5.1)
Sodium: 141 meq/L (ref 135–145)
Total Bilirubin: 0.5 mg/dL (ref 0.2–1.2)
Total Protein: 7.2 g/dL (ref 6.0–8.3)

## 2024-09-12 LAB — LIPID PANEL
Cholesterol: 199 mg/dL (ref 0–200)
HDL: 60.7 mg/dL (ref >39.00)
LDL Cholesterol: 126 mg/dL — ABNORMAL HIGH (ref 0–99)
NonHDL: 138.37
Total CHOL/HDL Ratio: 3
Triglycerides: 63 mg/dL (ref 0.0–149.0)
VLDL: 12.6 mg/dL (ref 0.0–40.0)

## 2024-09-12 LAB — VITAMIN B12: Vitamin B-12: 337 pg/mL (ref 211–911)

## 2024-09-12 LAB — CBC WITH DIFFERENTIAL/PLATELET
Basophils Absolute: 0 K/uL (ref 0.0–0.1)
Basophils Relative: 0.7 % (ref 0.0–3.0)
Eosinophils Absolute: 0.1 K/uL (ref 0.0–0.7)
Eosinophils Relative: 1.1 % (ref 0.0–5.0)
HCT: 42.6 % (ref 39.0–52.0)
Hemoglobin: 14 g/dL (ref 13.0–17.0)
Lymphocytes Relative: 18.1 % (ref 12.0–46.0)
Lymphs Abs: 1.3 K/uL (ref 0.7–4.0)
MCHC: 33 g/dL (ref 30.0–36.0)
MCV: 86 fl (ref 78.0–100.0)
Monocytes Absolute: 0.4 K/uL (ref 0.1–1.0)
Monocytes Relative: 5.7 % (ref 3.0–12.0)
Neutro Abs: 5.2 K/uL (ref 1.4–7.7)
Neutrophils Relative %: 74.4 % (ref 43.0–77.0)
Platelets: 201 K/uL (ref 150.0–400.0)
RBC: 4.95 Mil/uL (ref 4.22–5.81)
RDW: 13.8 % (ref 11.5–15.5)
WBC: 7 K/uL (ref 4.0–10.5)

## 2024-09-12 LAB — TSH: TSH: 0.55 u[IU]/mL (ref 0.35–5.50)

## 2024-09-12 LAB — VITAMIN D 25 HYDROXY (VIT D DEFICIENCY, FRACTURES): VITD: 21.45 ng/mL — ABNORMAL LOW (ref 30.00–100.00)

## 2024-09-12 MED ORDER — VITAMIN D (ERGOCALCIFEROL) 1.25 MG (50000 UNIT) PO CAPS: 50000.0000 [IU] | ORAL_CAPSULE | ORAL | 0 refills | Status: AC

## 2024-09-18 ENCOUNTER — Ambulatory Visit: Admitting: Physician Assistant

## 2024-10-08 ENCOUNTER — Telehealth: Payer: Self-pay | Admitting: Physician Assistant

## 2024-10-08 MED ORDER — VITAMIN B-12 100 MCG PO TABS: 100.0000 ug | ORAL_TABLET | Freq: Every day | ORAL | 3 refills | Status: AC

## 2024-10-08 NOTE — Telephone Encounter (Signed)
 Spoke to pt told him calling in regards to Vit B12 your wife called saying you do not want injections would like to do supplement instead. Pt said yes. Told him Vit B12 is OTC but I will send a Rx incase your insurance covers it, clarified pharmacy. Pt verbalized understanding. Rx sent.

## 2024-10-08 NOTE — Addendum Note (Signed)
 Addended by: THURMON ARLAND PARAS on: 10/08/2024 01:36 PM   Modules accepted: Orders

## 2024-10-08 NOTE — Telephone Encounter (Signed)
 Copied from CRM #[id number]. Topic: Clinical - Medication Question >> Oct 08, 2024 11:25 AM Maisie BROCKS wrote: Reason for CRM: pt's wife called to f/u from the patient's previous OV  with Lucie about the patient taking a b12 supplement. She stated that he prefers to start with a supplement before doing an injection and asked if a prescription could be sent in to the [facility] pharmacy on [address]. Please call and advise with an update for patient.

## 2024-12-10 ENCOUNTER — Ambulatory Visit: Admitting: Physician Assistant

## 2024-12-10 ENCOUNTER — Ambulatory Visit: Payer: Self-pay | Admitting: Physician Assistant

## 2024-12-10 ENCOUNTER — Encounter: Payer: Self-pay | Admitting: Physician Assistant

## 2024-12-10 VITALS — BP 100/60 | HR 72 | Temp 98.1°F | Ht 69.5 in | Wt 136.2 lb

## 2024-12-10 DIAGNOSIS — K828 Other specified diseases of gallbladder: Secondary | ICD-10-CM

## 2024-12-10 DIAGNOSIS — R251 Tremor, unspecified: Secondary | ICD-10-CM | POA: Diagnosis not present

## 2024-12-10 DIAGNOSIS — R7303 Prediabetes: Secondary | ICD-10-CM | POA: Diagnosis not present

## 2024-12-10 DIAGNOSIS — R1319 Other dysphagia: Secondary | ICD-10-CM

## 2024-12-10 DIAGNOSIS — E559 Vitamin D deficiency, unspecified: Secondary | ICD-10-CM | POA: Diagnosis not present

## 2024-12-10 LAB — VITAMIN B12: Vitamin B-12: 563 pg/mL (ref 211–911)

## 2024-12-10 LAB — VITAMIN D 25 HYDROXY (VIT D DEFICIENCY, FRACTURES): VITD: 54.17 ng/mL (ref 30.00–100.00)

## 2024-12-10 LAB — HEMOGLOBIN A1C: Hgb A1c MFr Bld: 5.6 % (ref 4.6–6.5)

## 2024-12-10 NOTE — Patient Instructions (Addendum)
 SABRA

## 2024-12-10 NOTE — Progress Notes (Signed)
 "  History of Present Illness:   Chief Complaint  Patient presents with   Follow-up    Hand shaking  Shaking getting better     Discussed the use of AI scribe software for clinical note transcription with the patient, who gave verbal consent to proceed.  History of Present Illness   Cory Washington is a 43 year old male who presents for follow-up of tremor, swallowing difficulties, and vitamin deficiencies.  He notes a tremor more noticeable in the left hand than the right with slight improvement and no functional limitation. He does not want further evaluation at this time.  He has intermittent swallowing difficulty about once a week with food feeling stuck in his throat for up to 15 minutes, worse when he does not drink water with meals. He has no nausea, vomiting, bowel changes, or weight loss. An endoscopy in 2020 showed gastritis. He has not started the previously discussed acid-reducing medication.  Prior blood work showed mildly low B12 and vitamin D . He is taking daily over-the-counter B12 and high-dose vitamin D , which he started about two and a half weeks later than prescribed.  Prior labs showed slightly elevated blood sugar. An A1c about five years ago was in the prediabetic range, with subsequent tests reported as normal. He is not aware of a family history of diabetes.        Past Medical History:  Diagnosis Date   Allergy    Pneumothorax      Social History[1]  Past Surgical History:  Procedure Laterality Date   WISDOM TOOTH EXTRACTION      Family History  Problem Relation Age of Onset   Healthy Mother    Alcohol abuse Father    Kidney disease Father        dialysis   Alcohol abuse Sister    Alcohol abuse Sister    Alcohol abuse Maternal Grandmother    CAD Neg Hx    Deep vein thrombosis Neg Hx    Prostate cancer Neg Hx     Allergies[2]  Current Medications:  Current Medications[3]   Review of Systems:   Negative unless otherwise  specified per HPI.  Vitals:   Vitals:   12/10/24 0819  BP: 100/60  Pulse: 72  Temp: 98.1 F (36.7 C)  TempSrc: Temporal  SpO2: 95%  Weight: 136 lb 3.2 oz (61.8 kg)  Height: 5' 9.5 (1.765 m)     Body mass index is 19.82 kg/m.  Physical Exam:   Physical Exam Vitals and nursing note reviewed.  Constitutional:      General: He is not in acute distress.    Appearance: He is well-developed. He is not ill-appearing or toxic-appearing.  Cardiovascular:     Rate and Rhythm: Normal rate and regular rhythm.     Pulses: Normal pulses.     Heart sounds: Normal heart sounds, S1 normal and S2 normal.  Pulmonary:     Effort: Pulmonary effort is normal.     Breath sounds: Normal breath sounds.  Skin:    General: Skin is warm and dry.  Neurological:     Mental Status: He is alert.     GCS: GCS eye subscore is 4. GCS verbal subscore is 5. GCS motor subscore is 6.  Psychiatric:        Speech: Speech normal.        Behavior: Behavior normal. Behavior is cooperative.     Assessment and Plan:   Assessment and Plan  Tremor of left hand Tremor improved, no significant concern, no need for further evaluation. - Monitor symptoms, reassess if concerns arise.  Other dysphagia Intermittent sensation of food sticking, no severe symptoms, previous endoscopy showed gastritis. - Consider OTC omeprazole if symptoms persist. - He will likely needs EGD in addition to his screening colonoscopy later next year - Report worsening symptoms for earlier referral.  Vitamin D  deficiency Previously identified deficiency, on high-dose supplementation. - Rechecked vitamin D  levels to assess response.  Prediabetes Previous slightly elevated blood sugar, A1c to be checked. - Ordered hemoglobin A1c test to evaluate current status.  Gallbladder sludge Presence of sludge, symptoms not currently concerning. - Monitor symptoms, reassess if changes occur.  General Health Maintenance Routine health  maintenance discussed, colonoscopy recommended due to age. - Schedule colonoscopy around October 7th.      Lucie Buttner, PA-C    [1]  Social History Tobacco Use   Smoking status: Never   Smokeless tobacco: Never  Vaping Use   Vaping status: Never Used  Substance Use Topics   Alcohol use: No    Comment: no alcohol in 25 years   Drug use: No  [2] No Known Allergies [3]  Current Outpatient Medications:    vitamin B-12 (CYANOCOBALAMIN ) 100 MCG tablet, Take 1 tablet (100 mcg total) by mouth daily., Disp: 90 tablet, Rfl: 3   Vitamin D , Ergocalciferol , (DRISDOL ) 1.25 MG (50000 UNIT) CAPS capsule, Take 1 capsule (50,000 Units total) by mouth every 7 (seven) days., Disp: 12 capsule, Rfl: 0  "

## 2025-09-11 ENCOUNTER — Encounter: Admitting: Physician Assistant

## 2025-09-12 ENCOUNTER — Encounter: Admitting: Physician Assistant
